# Patient Record
Sex: Male | Born: 1950
Health system: Southern US, Community
[De-identification: ages and names within clinical notes are randomized; demographics above are authoritative.]

## PROBLEM LIST (undated history)

## (undated) DIAGNOSIS — I2510 Atherosclerotic heart disease of native coronary artery without angina pectoris: Secondary | ICD-10-CM

## (undated) DIAGNOSIS — I639 Cerebral infarction, unspecified: Secondary | ICD-10-CM

---

## 2017-05-04 ENCOUNTER — Other Ambulatory Visit (HOSPITAL_COMMUNITY): Payer: Self-pay | Admitting: Family Medicine

## 2017-05-04 DIAGNOSIS — R1319 Other dysphagia: Secondary | ICD-10-CM

## 2017-05-05 ENCOUNTER — Other Ambulatory Visit: Payer: Self-pay

## 2017-05-05 ENCOUNTER — Inpatient Hospital Stay (HOSPITAL_COMMUNITY): Payer: Medicare Other

## 2017-05-05 ENCOUNTER — Encounter (HOSPITAL_COMMUNITY): Payer: Self-pay | Admitting: *Deleted

## 2017-05-05 ENCOUNTER — Emergency Department (HOSPITAL_COMMUNITY): Payer: Medicare Other

## 2017-05-05 ENCOUNTER — Inpatient Hospital Stay (HOSPITAL_COMMUNITY)
Admission: EM | Admit: 2017-05-05 | Discharge: 2017-05-23 | DRG: 871 | Disposition: A | Discharge status: Skilled Nursing Facility | Payer: Medicare Other | Attending: Family Medicine | Admitting: Family Medicine

## 2017-05-05 DIAGNOSIS — N3000 Acute cystitis without hematuria: Secondary | ICD-10-CM | POA: Diagnosis present

## 2017-05-05 DIAGNOSIS — E1165 Type 2 diabetes mellitus with hyperglycemia: Secondary | ICD-10-CM | POA: Diagnosis present

## 2017-05-05 DIAGNOSIS — B370 Candidal stomatitis: Secondary | ICD-10-CM | POA: Diagnosis present

## 2017-05-05 DIAGNOSIS — N183 Chronic kidney disease, stage 3 (moderate): Secondary | ICD-10-CM | POA: Diagnosis not present

## 2017-05-05 DIAGNOSIS — G9341 Metabolic encephalopathy: Secondary | ICD-10-CM | POA: Diagnosis present

## 2017-05-05 DIAGNOSIS — E118 Type 2 diabetes mellitus with unspecified complications: Secondary | ICD-10-CM | POA: Diagnosis not present

## 2017-05-05 DIAGNOSIS — J9601 Acute respiratory failure with hypoxia: Secondary | ICD-10-CM | POA: Diagnosis present

## 2017-05-05 DIAGNOSIS — R6521 Severe sepsis with septic shock: Secondary | ICD-10-CM | POA: Diagnosis present

## 2017-05-05 DIAGNOSIS — G931 Anoxic brain damage, not elsewhere classified: Secondary | ICD-10-CM | POA: Diagnosis present

## 2017-05-05 DIAGNOSIS — G8250 Quadriplegia, unspecified: Secondary | ICD-10-CM | POA: Diagnosis present

## 2017-05-05 DIAGNOSIS — C787 Secondary malignant neoplasm of liver and intrahepatic bile duct: Secondary | ICD-10-CM | POA: Diagnosis present

## 2017-05-05 DIAGNOSIS — Z9911 Dependence on respirator [ventilator] status: Secondary | ICD-10-CM

## 2017-05-05 DIAGNOSIS — N179 Acute kidney failure, unspecified: Secondary | ICD-10-CM | POA: Diagnosis present

## 2017-05-05 DIAGNOSIS — D638 Anemia in other chronic diseases classified elsewhere: Secondary | ICD-10-CM | POA: Diagnosis present

## 2017-05-05 DIAGNOSIS — Z66 Do not resuscitate: Secondary | ICD-10-CM | POA: Diagnosis not present

## 2017-05-05 DIAGNOSIS — A419 Sepsis, unspecified organism: Secondary | ICD-10-CM | POA: Diagnosis present

## 2017-05-05 DIAGNOSIS — G9340 Encephalopathy, unspecified: Secondary | ICD-10-CM | POA: Diagnosis not present

## 2017-05-05 DIAGNOSIS — Z9289 Personal history of other medical treatment: Secondary | ICD-10-CM

## 2017-05-05 DIAGNOSIS — E43 Unspecified severe protein-calorie malnutrition: Secondary | ICD-10-CM | POA: Diagnosis present

## 2017-05-05 DIAGNOSIS — Z79899 Other long term (current) drug therapy: Secondary | ICD-10-CM

## 2017-05-05 DIAGNOSIS — I639 Cerebral infarction, unspecified: Secondary | ICD-10-CM | POA: Diagnosis present

## 2017-05-05 DIAGNOSIS — I69354 Hemiplegia and hemiparesis following cerebral infarction affecting left non-dominant side: Secondary | ICD-10-CM | POA: Diagnosis not present

## 2017-05-05 DIAGNOSIS — L89152 Pressure ulcer of sacral region, stage 2: Secondary | ICD-10-CM | POA: Diagnosis present

## 2017-05-05 DIAGNOSIS — E872 Acidosis: Secondary | ICD-10-CM | POA: Diagnosis present

## 2017-05-05 DIAGNOSIS — J9602 Acute respiratory failure with hypercapnia: Secondary | ICD-10-CM | POA: Diagnosis not present

## 2017-05-05 DIAGNOSIS — E876 Hypokalemia: Secondary | ICD-10-CM | POA: Diagnosis not present

## 2017-05-05 DIAGNOSIS — R471 Dysarthria and anarthria: Secondary | ICD-10-CM | POA: Diagnosis present

## 2017-05-05 DIAGNOSIS — R1310 Dysphagia, unspecified: Secondary | ICD-10-CM | POA: Diagnosis not present

## 2017-05-05 DIAGNOSIS — D696 Thrombocytopenia, unspecified: Secondary | ICD-10-CM | POA: Diagnosis present

## 2017-05-05 DIAGNOSIS — C7971 Secondary malignant neoplasm of right adrenal gland: Secondary | ICD-10-CM | POA: Diagnosis not present

## 2017-05-05 DIAGNOSIS — J986 Disorders of diaphragm: Secondary | ICD-10-CM | POA: Diagnosis not present

## 2017-05-05 DIAGNOSIS — C7972 Secondary malignant neoplasm of left adrenal gland: Secondary | ICD-10-CM | POA: Diagnosis not present

## 2017-05-05 DIAGNOSIS — C7970 Secondary malignant neoplasm of unspecified adrenal gland: Secondary | ICD-10-CM | POA: Diagnosis present

## 2017-05-05 DIAGNOSIS — I2510 Atherosclerotic heart disease of native coronary artery without angina pectoris: Secondary | ICD-10-CM | POA: Diagnosis present

## 2017-05-05 DIAGNOSIS — E871 Hypo-osmolality and hyponatremia: Secondary | ICD-10-CM | POA: Diagnosis present

## 2017-05-05 DIAGNOSIS — E870 Hyperosmolality and hypernatremia: Secondary | ICD-10-CM | POA: Diagnosis present

## 2017-05-05 DIAGNOSIS — Z515 Encounter for palliative care: Secondary | ICD-10-CM | POA: Diagnosis not present

## 2017-05-05 DIAGNOSIS — J690 Pneumonitis due to inhalation of food and vomit: Secondary | ICD-10-CM | POA: Diagnosis present

## 2017-05-05 DIAGNOSIS — R918 Other nonspecific abnormal finding of lung field: Secondary | ICD-10-CM | POA: Diagnosis present

## 2017-05-05 DIAGNOSIS — C801 Malignant (primary) neoplasm, unspecified: Secondary | ICD-10-CM | POA: Diagnosis not present

## 2017-05-05 DIAGNOSIS — Z681 Body mass index (BMI) 19 or less, adult: Secondary | ICD-10-CM | POA: Diagnosis not present

## 2017-05-05 DIAGNOSIS — Z7189 Other specified counseling: Secondary | ICD-10-CM

## 2017-05-05 DIAGNOSIS — Z794 Long term (current) use of insulin: Secondary | ICD-10-CM

## 2017-05-05 DIAGNOSIS — R571 Hypovolemic shock: Secondary | ICD-10-CM | POA: Diagnosis present

## 2017-05-05 DIAGNOSIS — K769 Liver disease, unspecified: Secondary | ICD-10-CM | POA: Diagnosis not present

## 2017-05-05 DIAGNOSIS — I69320 Aphasia following cerebral infarction: Secondary | ICD-10-CM

## 2017-05-05 DIAGNOSIS — Z7982 Long term (current) use of aspirin: Secondary | ICD-10-CM

## 2017-05-05 DIAGNOSIS — I700 Atherosclerosis of aorta: Secondary | ICD-10-CM | POA: Diagnosis present

## 2017-05-05 DIAGNOSIS — T80212A Local infection due to central venous catheter, initial encounter: Secondary | ICD-10-CM

## 2017-05-05 DIAGNOSIS — Z4659 Encounter for fitting and adjustment of other gastrointestinal appliance and device: Secondary | ICD-10-CM

## 2017-05-05 DIAGNOSIS — Z7401 Bed confinement status: Secondary | ICD-10-CM

## 2017-05-05 DIAGNOSIS — I10 Essential (primary) hypertension: Secondary | ICD-10-CM | POA: Diagnosis present

## 2017-05-05 DIAGNOSIS — T80212D Local infection due to central venous catheter, subsequent encounter: Secondary | ICD-10-CM | POA: Diagnosis not present

## 2017-05-05 DIAGNOSIS — I5030 Unspecified diastolic (congestive) heart failure: Secondary | ICD-10-CM | POA: Diagnosis not present

## 2017-05-05 DIAGNOSIS — J189 Pneumonia, unspecified organism: Secondary | ICD-10-CM

## 2017-05-05 DIAGNOSIS — R400 Somnolence: Secondary | ICD-10-CM | POA: Diagnosis not present

## 2017-05-05 DIAGNOSIS — J9600 Acute respiratory failure, unspecified whether with hypoxia or hypercapnia: Secondary | ICD-10-CM

## 2017-05-05 DIAGNOSIS — E785 Hyperlipidemia, unspecified: Secondary | ICD-10-CM | POA: Diagnosis present

## 2017-05-05 DIAGNOSIS — I63512 Cerebral infarction due to unspecified occlusion or stenosis of left middle cerebral artery: Secondary | ICD-10-CM | POA: Diagnosis not present

## 2017-05-05 DIAGNOSIS — R410 Disorientation, unspecified: Secondary | ICD-10-CM | POA: Diagnosis not present

## 2017-05-05 DIAGNOSIS — I69391 Dysphagia following cerebral infarction: Secondary | ICD-10-CM

## 2017-05-05 DIAGNOSIS — J9690 Respiratory failure, unspecified, unspecified whether with hypoxia or hypercapnia: Secondary | ICD-10-CM

## 2017-05-05 DIAGNOSIS — L8990 Pressure ulcer of unspecified site, unspecified stage: Secondary | ICD-10-CM | POA: Diagnosis present

## 2017-05-05 HISTORY — DX: Cerebral infarction, unspecified: I63.9

## 2017-05-05 HISTORY — DX: Atherosclerotic heart disease of native coronary artery without angina pectoris: I25.10

## 2017-05-05 LAB — COMPREHENSIVE METABOLIC PANEL
ALBUMIN: 2.6 g/dL — AB (ref 3.5–5.0)
ALT: 39 U/L (ref 17–63)
AST: 59 U/L — AB (ref 15–41)
Alkaline Phosphatase: 433 U/L — ABNORMAL HIGH (ref 38–126)
Anion gap: 20 — ABNORMAL HIGH (ref 5–15)
BILIRUBIN TOTAL: 0.8 mg/dL (ref 0.3–1.2)
BUN: 91 mg/dL — AB (ref 6–20)
CHLORIDE: 124 mmol/L — AB (ref 101–111)
CO2: 19 mmol/L — ABNORMAL LOW (ref 22–32)
Calcium: 9 mg/dL (ref 8.9–10.3)
Creatinine, Ser: 3.48 mg/dL — ABNORMAL HIGH (ref 0.61–1.24)
GFR calc Af Amer: 19 mL/min — ABNORMAL LOW (ref >60)
GFR calc non Af Amer: 17 mL/min — ABNORMAL LOW (ref >60)
Glucose, Bld: 246 mg/dL — ABNORMAL HIGH (ref 65–99)
POTASSIUM: 4.7 mmol/L (ref 3.5–5.1)
Sodium: 163 mmol/L (ref 135–145)
TOTAL PROTEIN: 7 g/dL (ref 6.5–8.1)

## 2017-05-05 LAB — CBC WITH DIFFERENTIAL/PLATELET
BASOS ABS: 0 10*3/uL (ref 0.0–0.1)
Basophils Relative: 0 %
Eosinophils Absolute: 0 10*3/uL (ref 0.0–0.7)
Eosinophils Relative: 0 %
HEMATOCRIT: 48.8 % (ref 39.0–52.0)
HEMOGLOBIN: 13.8 g/dL (ref 13.0–17.0)
LYMPHS ABS: 2.3 10*3/uL (ref 0.7–4.0)
LYMPHS PCT: 11 %
MCH: 28.5 pg (ref 26.0–34.0)
MCHC: 28.3 g/dL — ABNORMAL LOW (ref 30.0–36.0)
MCV: 100.8 fL — ABNORMAL HIGH (ref 78.0–100.0)
MONOS PCT: 5 %
Monocytes Absolute: 1.1 10*3/uL — ABNORMAL HIGH (ref 0.1–1.0)
NEUTROS PCT: 84 %
Neutro Abs: 17.6 10*3/uL — ABNORMAL HIGH (ref 1.7–7.7)
Platelets: 249 10*3/uL (ref 150–400)
RBC: 4.84 MIL/uL (ref 4.22–5.81)
RDW: 16.7 % — AB (ref 11.5–15.5)
WBC: 21 10*3/uL — AB (ref 4.0–10.5)

## 2017-05-05 LAB — I-STAT ARTERIAL BLOOD GAS, ED
Acid-base deficit: 10 mmol/L — ABNORMAL HIGH (ref 0.0–2.0)
Bicarbonate: 19.8 mmol/L — ABNORMAL LOW (ref 20.0–28.0)
O2 SAT: 64 %
TCO2: 22 mmol/L (ref 22–32)
pCO2 arterial: 69.6 mmHg (ref 32.0–48.0)
pH, Arterial: 7.07 — CL (ref 7.350–7.450)
pO2, Arterial: 51 mmHg — ABNORMAL LOW (ref 83.0–108.0)

## 2017-05-05 LAB — URINALYSIS, ROUTINE W REFLEX MICROSCOPIC
Bilirubin Urine: NEGATIVE
Glucose, UA: NEGATIVE mg/dL
KETONES UR: 5 mg/dL — AB
Leukocytes, UA: NEGATIVE
Nitrite: NEGATIVE
PROTEIN: 100 mg/dL — AB
SQUAMOUS EPITHELIAL / LPF: NONE SEEN
Specific Gravity, Urine: 1.021 (ref 1.005–1.030)
pH: 7 (ref 5.0–8.0)

## 2017-05-05 LAB — I-STAT CG4 LACTIC ACID, ED: LACTIC ACID, VENOUS: 9.93 mmol/L — AB (ref 0.5–1.9)

## 2017-05-05 LAB — I-STAT TROPONIN, ED: TROPONIN I, POC: 0.01 ng/mL (ref 0.00–0.08)

## 2017-05-05 MED ORDER — MIDAZOLAM HCL 2 MG/2ML IJ SOLN
INTRAMUSCULAR | Status: AC
  Filled 2017-05-05: qty 2

## 2017-05-05 MED ORDER — PIPERACILLIN-TAZOBACTAM 3.375 G IVPB
3.3750 g | Freq: Three times a day (TID) | INTRAVENOUS | Status: AC
  Administered 2017-05-06 – 2017-05-11 (×18): 3.375 g via INTRAVENOUS
  Filled 2017-05-05 (×19): qty 50

## 2017-05-05 MED ORDER — FENTANYL CITRATE (PF) 100 MCG/2ML IJ SOLN
50.0000 ug | INTRAMUSCULAR | Status: DC | PRN
  Administered 2017-05-07: 50 ug via INTRAVENOUS
  Filled 2017-05-05: qty 2

## 2017-05-05 MED ORDER — NOREPINEPHRINE BITARTRATE 1 MG/ML IV SOLN
0.0000 ug/min | Freq: Once | INTRAVENOUS | Status: AC
  Administered 2017-05-05: 10 ug/min via INTRAVENOUS

## 2017-05-05 MED ORDER — FENTANYL 2500MCG IN NS 250ML (10MCG/ML) PREMIX INFUSION: 0.0000 ug/h | INTRAVENOUS | Status: DC

## 2017-05-05 MED ORDER — FENTANYL CITRATE (PF) 100 MCG/2ML IJ SOLN: 50.0000 ug | INTRAMUSCULAR | Status: DC | PRN

## 2017-05-05 MED ORDER — ACETAMINOPHEN 650 MG RE SUPP
650.0000 mg | Freq: Once | RECTAL | Status: AC
  Administered 2017-05-05: 650 mg via RECTAL
  Filled 2017-05-05: qty 1

## 2017-05-05 MED ORDER — INSULIN ASPART 100 UNIT/ML ~~LOC~~ SOLN: 2.0000 [IU] | SUBCUTANEOUS | Status: DC

## 2017-05-05 MED ORDER — HEPARIN SODIUM (PORCINE) 5000 UNIT/ML IJ SOLN
5000.0000 [IU] | Freq: Three times a day (TID) | INTRAMUSCULAR | Status: DC
  Administered 2017-05-06 – 2017-05-08 (×8): 5000 [IU] via SUBCUTANEOUS
  Filled 2017-05-05 (×11): qty 1

## 2017-05-05 MED ORDER — DEXTROSE 5 % IV SOLN
0.0000 ug/min | INTRAVENOUS | Status: DC
  Administered 2017-05-05: 15 ug/min via INTRAVENOUS
  Administered 2017-05-06: 5 ug/min via INTRAVENOUS
  Filled 2017-05-05 (×3): qty 4

## 2017-05-05 MED ORDER — PIPERACILLIN-TAZOBACTAM 3.375 G IVPB 30 MIN
3.3750 g | Freq: Once | INTRAVENOUS | Status: AC
  Administered 2017-05-05: 3.375 g via INTRAVENOUS
  Filled 2017-05-05: qty 50

## 2017-05-05 MED ORDER — MIDAZOLAM HCL 2 MG/2ML IJ SOLN: 2.0000 mg | Freq: Once | INTRAMUSCULAR | Status: DC

## 2017-05-05 MED ORDER — SODIUM CHLORIDE 0.9 % IV BOLUS (SEPSIS): 1000.0000 mL | Freq: Once | INTRAVENOUS | Status: DC

## 2017-05-05 MED ORDER — SODIUM CHLORIDE 0.9 % IV BOLUS (SEPSIS)
500.0000 mL | Freq: Once | INTRAVENOUS | Status: AC
  Administered 2017-05-05: 500 mL via INTRAVENOUS

## 2017-05-05 MED ORDER — SODIUM CHLORIDE 0.9 % IV BOLUS (SEPSIS)
1000.0000 mL | Freq: Once | INTRAVENOUS | Status: AC
  Administered 2017-05-05: 1000 mL via INTRAVENOUS

## 2017-05-05 MED ORDER — VANCOMYCIN HCL IN DEXTROSE 1-5 GM/200ML-% IV SOLN
1000.0000 mg | Freq: Once | INTRAVENOUS | Status: AC
  Administered 2017-05-05: 1000 mg via INTRAVENOUS
  Filled 2017-05-05: qty 200

## 2017-05-05 MED ORDER — FREE WATER
200.0000 mL | Freq: Four times a day (QID) | Status: DC
  Administered 2017-05-06 – 2017-05-07 (×6): 200 mL

## 2017-05-05 MED ORDER — PROPOFOL 1000 MG/100ML IV EMUL: 5.0000 ug/kg/min | Freq: Once | INTRAVENOUS | Status: DC

## 2017-05-05 MED ORDER — VANCOMYCIN HCL IN DEXTROSE 1-5 GM/200ML-% IV SOLN: 1000.0000 mg | INTRAVENOUS | Status: DC

## 2017-05-05 MED ORDER — SODIUM CHLORIDE 0.9 % IV SOLN: 250.0000 mL | INTRAVENOUS | Status: DC | PRN

## 2017-05-05 MED ORDER — PANTOPRAZOLE SODIUM 40 MG IV SOLR
40.0000 mg | Freq: Every day | INTRAVENOUS | Status: DC
  Administered 2017-05-06 – 2017-05-13 (×9): 40 mg via INTRAVENOUS
  Filled 2017-05-05 (×12): qty 40

## 2017-05-05 NOTE — ED Provider Notes (Signed)
I saw and evaluated the patient, reviewed the resident's note and I agree with the findings and plan.   EKG Interpretation  Date/Time:  Saturday May 05 2017 21:11:40 EST Ventricular Rate:  137 PR Interval:    QRS Duration: 91 QT Interval:  276 QTC Calculation: 417 R Axis:   49 Text Interpretation:  Sinus tachycardia with irregular rate Inferoposterior infarct, recent Lateral leads are also involved no prior EKG  Confirmed by Brantley Stage [phone]) on 05/05/2017 9:44:01 PM      CRITICAL CARE Performed by: Forde Dandy   Total critical care time: 35 minutes  Critical care time was exclusive of separately billable procedures and treating other patients.  Critical care was necessary to treat or prevent imminent or life-threatening deterioration.  Critical care was time spent personally by me on the following activities: development of treatment plan with patient and/or surrogate as well as nursing, discussions with consultants, evaluation of patient's response to treatment, examination of patient, obtaining history from patient or surrogate, ordering and performing treatments and interventions, ordering and review of laboratory studies, ordering and review of radiographic studies, pulse oximetry and re-evaluation of patient's condition.   66 year old male with previous history of CVA who presents with respiratory failure from skilled nursing facility.  Per facility staff, staff members were feeding him when he started to choke and have shortness of breath.  Became unconscious.  EMS was called.  He was intubated by EMS, and brought to ED for evaluation. Per facility, he is full code.   In ED, patient is febrile, tachycardic, and hypotensive.  Had persistent hypotension despite ongoing IV fluids, and started on peripheral Levophed.  Code sepsis is activated, and he is receiving 2.5 L of IV fluids as well as vancomycin and Zosyn empirically.  Elevated lactate of 9.93 and leukocytosis of 21. CXR  w/o obvious infiltrate. ETT advanced after CXR. UA pending. Plan for admission to ICU for respiratory failure due to choking event and septic shock.   Forde Dandy, MD 05/05/17 [phone]

## 2017-05-05 NOTE — Progress Notes (Signed)
Pharmacy Antibiotic Note  William Nolan is a 66 y.o. male admitted on 05/05/2017 from NH with SOB/VDRF/PNA/sepsis.  Pharmacy has been consulted for .Vancomycin and Zosyn  Dosing.  Vancomycin 1 g IV given in ED at  2145  Plan: Vancomycin 1 g IV q48h Zosyn 3.375 g IV q8h  F/U renal function  Height: 6' (182.9 cm) Weight: 150 lb (68 kg) IBW/kg (Calculated) : 77.6  Temp (24hrs), Avg:100.4 F (38 C), Min:99.9 F (37.7 C), Max:101.1 F (38.4 C)  Recent Labs  Lab 05/05/17 2138 05/05/17 2144  WBC  --  21.0*  CREATININE  --  3.48*  LATICACIDVEN 9.93*  --     Estimated Creatinine Clearance: 19.5 mL/min (A) (by C-G formula based on SCr of 3.48 mg/dL (H)).    No Known Allergies  Caryl Pina 05/05/2017 11:38 PM
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
## 2017-05-05 NOTE — ED Notes (Signed)
1000ccnss bolus

## 2017-05-05 NOTE — ED Notes (Signed)
pvcs increasing still no response to any hting

## 2017-05-05 NOTE — ED Notes (Signed)
Rep;ort called to robin rn on 14m

## 2017-05-05 NOTE — ED Notes (Signed)
The pt has had no movement of any type
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
## 2017-05-05 NOTE — ED Notes (Signed)
2nd bolus nss 1000

## 2017-05-06 ENCOUNTER — Inpatient Hospital Stay (HOSPITAL_COMMUNITY): Payer: Medicare Other

## 2017-05-06 DIAGNOSIS — Z9911 Dependence on respirator [ventilator] status: Secondary | ICD-10-CM

## 2017-05-06 DIAGNOSIS — N3000 Acute cystitis without hematuria: Secondary | ICD-10-CM | POA: Diagnosis present

## 2017-05-06 DIAGNOSIS — I63512 Cerebral infarction due to unspecified occlusion or stenosis of left middle cerebral artery: Secondary | ICD-10-CM

## 2017-05-06 DIAGNOSIS — A419 Sepsis, unspecified organism: Secondary | ICD-10-CM | POA: Diagnosis present

## 2017-05-06 DIAGNOSIS — J690 Pneumonitis due to inhalation of food and vomit: Secondary | ICD-10-CM | POA: Diagnosis present

## 2017-05-06 DIAGNOSIS — E870 Hyperosmolality and hypernatremia: Secondary | ICD-10-CM | POA: Diagnosis present

## 2017-05-06 DIAGNOSIS — R6521 Severe sepsis with septic shock: Secondary | ICD-10-CM

## 2017-05-06 DIAGNOSIS — N179 Acute kidney failure, unspecified: Secondary | ICD-10-CM | POA: Diagnosis present

## 2017-05-06 LAB — BASIC METABOLIC PANEL
ANION GAP: 13 (ref 5–15)
Anion gap: 10 (ref 5–15)
Anion gap: 9 (ref 5–15)
Anion gap: 11 (ref 5–15)
BUN: 79 mg/dL — ABNORMAL HIGH (ref 6–20)
BUN: 76 mg/dL — AB (ref 6–20)
BUN: 76 mg/dL — AB (ref 6–20)
BUN: 80 mg/dL — AB (ref 6–20)
CALCIUM: 7.4 mg/dL — AB (ref 8.9–10.3)
CHLORIDE: 126 mmol/L — AB (ref 101–111)
CHLORIDE: 125 mmol/L — AB (ref 101–111)
CHLORIDE: 126 mmol/L — AB (ref 101–111)
CHLORIDE: 122 mmol/L — AB (ref 101–111)
CO2: 18 mmol/L — AB (ref 22–32)
CO2: 21 mmol/L — AB (ref 22–32)
CO2: 22 mmol/L (ref 22–32)
CO2: 22 mmol/L (ref 22–32)
CREATININE: 2.44 mg/dL — AB (ref 0.61–1.24)
CREATININE: 2.41 mg/dL — AB (ref 0.61–1.24)
CREATININE: 2.41 mg/dL — AB (ref 0.61–1.24)
Calcium: 7.3 mg/dL — ABNORMAL LOW (ref 8.9–10.3)
Calcium: 7.5 mg/dL — ABNORMAL LOW (ref 8.9–10.3)
Calcium: 7.5 mg/dL — ABNORMAL LOW (ref 8.9–10.3)
Creatinine, Ser: 2.64 mg/dL — ABNORMAL HIGH (ref 0.61–1.24)
GFR calc Af Amer: 27 mL/min — ABNORMAL LOW (ref >60)
GFR calc Af Amer: 30 mL/min — ABNORMAL LOW (ref >60)
GFR calc Af Amer: 30 mL/min — ABNORMAL LOW (ref >60)
GFR calc non Af Amer: 23 mL/min — ABNORMAL LOW (ref >60)
GFR calc non Af Amer: 26 mL/min — ABNORMAL LOW (ref >60)
GFR calc non Af Amer: 26 mL/min — ABNORMAL LOW (ref >60)
GFR calc non Af Amer: 26 mL/min — ABNORMAL LOW (ref >60)
GFR, EST AFRICAN AMERICAN: 30 mL/min — AB (ref >60)
Glucose, Bld: 252 mg/dL — ABNORMAL HIGH (ref 65–99)
Glucose, Bld: 225 mg/dL — ABNORMAL HIGH (ref 65–99)
Glucose, Bld: 130 mg/dL — ABNORMAL HIGH (ref 65–99)
Glucose, Bld: 292 mg/dL — ABNORMAL HIGH (ref 65–99)
Potassium: 3.9 mmol/L (ref 3.5–5.1)
Potassium: 3.8 mmol/L (ref 3.5–5.1)
Potassium: 3.7 mmol/L (ref 3.5–5.1)
Potassium: 3.4 mmol/L — ABNORMAL LOW (ref 3.5–5.1)
SODIUM: 157 mmol/L — AB (ref 135–145)
SODIUM: 157 mmol/L — AB (ref 135–145)
SODIUM: 155 mmol/L — AB (ref 135–145)
Sodium: 156 mmol/L — ABNORMAL HIGH (ref 135–145)

## 2017-05-06 LAB — LACTIC ACID, PLASMA
LACTIC ACID, VENOUS: 3.7 mmol/L — AB (ref 0.5–1.9)
LACTIC ACID, VENOUS: 5.8 mmol/L — AB (ref 0.5–1.9)
LACTIC ACID, VENOUS: 8.1 mmol/L — AB (ref 0.5–1.9)

## 2017-05-06 LAB — GLUCOSE, CAPILLARY
GLUCOSE-CAPILLARY: 104 mg/dL — AB (ref 65–99)
GLUCOSE-CAPILLARY: 165 mg/dL — AB (ref 65–99)
GLUCOSE-CAPILLARY: 185 mg/dL — AB (ref 65–99)
GLUCOSE-CAPILLARY: 253 mg/dL — AB (ref 65–99)
GLUCOSE-CAPILLARY: 255 mg/dL — AB (ref 65–99)
Glucose-Capillary: 224 mg/dL — ABNORMAL HIGH (ref 65–99)
Glucose-Capillary: 233 mg/dL — ABNORMAL HIGH (ref 65–99)

## 2017-05-06 LAB — BLOOD CULTURE ID PANEL (REFLEXED)
Acinetobacter baumannii: NOT DETECTED
CANDIDA GLABRATA: NOT DETECTED
CANDIDA TROPICALIS: NOT DETECTED
Candida albicans: NOT DETECTED
Candida krusei: NOT DETECTED
Candida parapsilosis: NOT DETECTED
ENTEROBACTER CLOACAE COMPLEX: NOT DETECTED
Enterobacteriaceae species: NOT DETECTED
Enterococcus species: NOT DETECTED
Escherichia coli: NOT DETECTED
HAEMOPHILUS INFLUENZAE: NOT DETECTED
KLEBSIELLA PNEUMONIAE: NOT DETECTED
Klebsiella oxytoca: NOT DETECTED
Listeria monocytogenes: NOT DETECTED
Methicillin resistance: DETECTED — AB
NEISSERIA MENINGITIDIS: NOT DETECTED
PROTEUS SPECIES: NOT DETECTED
Pseudomonas aeruginosa: NOT DETECTED
STAPHYLOCOCCUS SPECIES: DETECTED — AB
STREPTOCOCCUS PYOGENES: NOT DETECTED
STREPTOCOCCUS SPECIES: NOT DETECTED
Serratia marcescens: NOT DETECTED
Staphylococcus aureus (BCID): NOT DETECTED
Streptococcus agalactiae: NOT DETECTED
Streptococcus pneumoniae: NOT DETECTED

## 2017-05-06 LAB — BLOOD GAS, ARTERIAL
Acid-base deficit: 6.6 mmol/L — ABNORMAL HIGH (ref 0.0–2.0)
Bicarbonate: 18.5 mmol/L — ABNORMAL LOW (ref 20.0–28.0)
DRAWN BY: 36277
FIO2: 0.6
MECHVT: 560 mL
O2 Saturation: 85 %
PH ART: 7.323 — AB (ref 7.350–7.450)
PIP: 8 cmH2O
PO2 ART: 50.6 mmHg — AB (ref 83.0–108.0)
Patient temperature: 96.9
RATE: 26 resp/min
pCO2 arterial: 36.1 mmHg (ref 32.0–48.0)

## 2017-05-06 LAB — CBC
HCT: 38 % — ABNORMAL LOW (ref 39.0–52.0)
HEMOGLOBIN: 10.9 g/dL — AB (ref 13.0–17.0)
MCH: 27.9 pg (ref 26.0–34.0)
MCHC: 28.7 g/dL — ABNORMAL LOW (ref 30.0–36.0)
MCV: 97.4 fL (ref 78.0–100.0)
Platelets: 165 10*3/uL (ref 150–400)
RBC: 3.9 MIL/uL — AB (ref 4.22–5.81)
RDW: 16.4 % — ABNORMAL HIGH (ref 11.5–15.5)
WBC: 19.1 10*3/uL — ABNORMAL HIGH (ref 4.0–10.5)

## 2017-05-06 LAB — PROCALCITONIN
PROCALCITONIN: 3.63 ng/mL
Procalcitonin: 4.31 ng/mL

## 2017-05-06 LAB — TROPONIN I
TROPONIN I: 0.04 ng/mL — AB (ref <0.03)
TROPONIN I: 0.06 ng/mL — AB (ref <0.03)
TROPONIN I: 0.06 ng/mL — AB (ref <0.03)

## 2017-05-06 LAB — MAGNESIUM
MAGNESIUM: 2 mg/dL (ref 1.7–2.4)
Magnesium: 2.2 mg/dL (ref 1.7–2.4)
Magnesium: 2 mg/dL (ref 1.7–2.4)

## 2017-05-06 LAB — AMMONIA
AMMONIA: 63 umol/L — AB (ref 9–35)
AMMONIA: 37 umol/L — AB (ref 9–35)

## 2017-05-06 LAB — PHOSPHORUS
PHOSPHORUS: 4.8 mg/dL — AB (ref 2.5–4.6)
PHOSPHORUS: 2.9 mg/dL (ref 2.5–4.6)
Phosphorus: 3.3 mg/dL (ref 2.5–4.6)

## 2017-05-06 LAB — MRSA PCR SCREENING: MRSA by PCR: NEGATIVE

## 2017-05-06 MED ORDER — INSULIN ASPART 100 UNIT/ML ~~LOC~~ SOLN
3.0000 [IU] | SUBCUTANEOUS | Status: DC
  Administered 2017-05-06 – 2017-05-10 (×17): 3 [IU] via SUBCUTANEOUS

## 2017-05-06 MED ORDER — LACTATED RINGERS IV BOLUS (SEPSIS)
1000.0000 mL | Freq: Once | INTRAVENOUS | Status: AC
  Administered 2017-05-06: 1000 mL via INTRAVENOUS

## 2017-05-06 MED ORDER — INSULIN ASPART 100 UNIT/ML ~~LOC~~ SOLN
0.0000 [IU] | SUBCUTANEOUS | Status: DC
  Administered 2017-05-06: 5 [IU] via SUBCUTANEOUS
  Administered 2017-05-06: 2 [IU] via SUBCUTANEOUS
  Administered 2017-05-06: 5 [IU] via SUBCUTANEOUS
  Administered 2017-05-06: 3 [IU] via SUBCUTANEOUS
  Administered 2017-05-07: 1 [IU] via SUBCUTANEOUS
  Administered 2017-05-07: 3 [IU] via SUBCUTANEOUS
  Administered 2017-05-07 (×3): 2 [IU] via SUBCUTANEOUS
  Administered 2017-05-08: 3 [IU] via SUBCUTANEOUS
  Administered 2017-05-08 (×4): 1 [IU] via SUBCUTANEOUS
  Administered 2017-05-08: 5 [IU] via SUBCUTANEOUS
  Administered 2017-05-09: 2 [IU] via SUBCUTANEOUS
  Administered 2017-05-09 (×2): 3 [IU] via SUBCUTANEOUS
  Administered 2017-05-09: 5 [IU] via SUBCUTANEOUS
  Administered 2017-05-09: 3 [IU] via SUBCUTANEOUS
  Administered 2017-05-09: 2 [IU] via SUBCUTANEOUS
  Administered 2017-05-10: 5 [IU] via SUBCUTANEOUS
  Administered 2017-05-10 (×2): 7 [IU] via SUBCUTANEOUS
  Administered 2017-05-10: 3 [IU] via SUBCUTANEOUS

## 2017-05-06 MED ORDER — VITAL HIGH PROTEIN PO LIQD
1000.0000 mL | ORAL | Status: DC
  Administered 2017-05-06: 1000 mL

## 2017-05-06 MED ORDER — SODIUM CHLORIDE 0.9 % IV BOLUS (SEPSIS)
500.0000 mL | Freq: Once | INTRAVENOUS | Status: AC
  Administered 2017-05-06: 500 mL via INTRAVENOUS

## 2017-05-06 MED ORDER — PRO-STAT SUGAR FREE PO LIQD
30.0000 mL | Freq: Two times a day (BID) | ORAL | Status: DC
  Administered 2017-05-06 (×2): 30 mL
  Filled 2017-05-06 (×4): qty 30

## 2017-05-06 MED ORDER — LACTATED RINGERS IV SOLN
INTRAVENOUS | Status: DC
  Administered 2017-05-06 – 2017-05-07 (×3): via INTRAVENOUS

## 2017-05-06 MED ORDER — ACETAMINOPHEN 160 MG/5ML PO SOLN
650.0000 mg | Freq: Four times a day (QID) | ORAL | Status: DC | PRN
  Administered 2017-05-06 – 2017-05-08 (×3): 650 mg
  Filled 2017-05-06 (×3): qty 20.3

## 2017-05-06 MED ORDER — ASPIRIN 325 MG PO TABS
325.0000 mg | ORAL_TABLET | Freq: Every day | ORAL | Status: DC
  Administered 2017-05-06: 325 mg
  Filled 2017-05-06 (×2): qty 1

## 2017-05-06 NOTE — Progress Notes (Signed)
Pt transferred from the ER to [id number] w/o complications. Uneventful trip. Pt is stable at this time. RN's at bedside

## 2017-05-06 NOTE — Progress Notes (Signed)
Received pt from ED, slight withdrawal to painful stimuli, levophed @ 15 mcg LPIV, oral ETT in place.

## 2017-05-06 NOTE — Progress Notes (Signed)
Called [facility] and spoke to nurse regarding pt cvp 3 and sudden short duration of  HR 140 duration.

## 2017-05-06 NOTE — Progress Notes (Signed)
Tropin 0.04 reported to Hayden Pedro, NP at bedside

## 2017-05-06 NOTE — Progress Notes (Signed)
PHARMACY - PHYSICIAN COMMUNICATION CRITICAL VALUE ALERT - BLOOD CULTURE IDENTIFICATION (BCID)  William Nolan is an 66 y.o. male who presented to [facility] on 05/05/2017 with a chief complaint of choking event and unresponsive  Assessment:  Admitted following aspiration event. Also with probable UTI.   Name of physician (or Provider) Contacted: Dr. Oletta Darter  Current antibiotics: Vancomycin + Zosyn  Changes to prescribed antibiotics recommended:  No changes recommended.  Likely contaminant as only in 1 set of BCx.  Continue to follow clinical progression    Results for orders placed or performed during the hospital encounter of 05/05/17  Blood Culture ID Panel (Reflexed) (Collected: 05/05/2017  9:44 PM)  Result Value Ref Range   Enterococcus species NOT DETECTED NOT DETECTED   Listeria monocytogenes NOT DETECTED NOT DETECTED   Staphylococcus species DETECTED (A) NOT DETECTED   Staphylococcus aureus NOT DETECTED NOT DETECTED   Methicillin resistance DETECTED (A) NOT DETECTED   Streptococcus species NOT DETECTED NOT DETECTED   Streptococcus agalactiae NOT DETECTED NOT DETECTED   Streptococcus pneumoniae NOT DETECTED NOT DETECTED   Streptococcus pyogenes NOT DETECTED NOT DETECTED   Acinetobacter baumannii NOT DETECTED NOT DETECTED   Enterobacteriaceae species NOT DETECTED NOT DETECTED   Enterobacter cloacae complex NOT DETECTED NOT DETECTED   Escherichia coli NOT DETECTED NOT DETECTED   Klebsiella oxytoca NOT DETECTED NOT DETECTED   Klebsiella pneumoniae NOT DETECTED NOT DETECTED   Proteus species NOT DETECTED NOT DETECTED   Serratia marcescens NOT DETECTED NOT DETECTED   Haemophilus influenzae NOT DETECTED NOT DETECTED   Neisseria meningitidis NOT DETECTED NOT DETECTED   Pseudomonas aeruginosa NOT DETECTED NOT DETECTED   Candida albicans NOT DETECTED NOT DETECTED   Candida glabrata NOT DETECTED NOT DETECTED   Candida krusei NOT DETECTED NOT DETECTED   Candida parapsilosis NOT  DETECTED NOT DETECTED   Candida tropicalis NOT DETECTED NOT DETECTED    William Nolan 05/06/2017  8:39 PM

## 2017-05-06 NOTE — Progress Notes (Signed)
  Heart Rate was too high (between 130-140 bpm).  Nurse was notified that the rate was too high to do the echo.  Will attempt later.
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
## 2017-05-06 NOTE — Procedures (Signed)
Central Venous Catheter Insertion Procedure Note William Nolan [id number] [birth year]  Procedure: Insertion of Central Venous Catheter Indications: Assessment of intravascular volume, Drug and/or fluid administration and Frequent blood sampling  Procedure Details Consent: Unable to obtain consent because of Unable to reach family, patient intubated, and medical need of line placement . Time Out: Verified patient identification, verified procedure, site/side was marked, verified correct patient position, special equipment/implants available, medications/allergies/relevent history reviewed, required imaging and test results available.  Performed  Maximum sterile technique was used including antiseptics, cap, gloves, gown, hand hygiene, mask and sheet. Skin prep: Chlorhexidine; local anesthetic administered A antimicrobial bonded/coated triple lumen catheter was placed in the right internal jugular vein using the Seldinger technique.  Evaluation Blood flow good Complications: No apparent complications Patient did tolerate procedure well. Chest X-ray ordered to verify placement.  CXR: pending.  Hayden Pedro, AGACNP-BC [facility]  Pgr: [phone]  PCCM Pgr: [phone]
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
## 2017-05-07 ENCOUNTER — Inpatient Hospital Stay (HOSPITAL_COMMUNITY): Payer: Medicare Other

## 2017-05-07 DIAGNOSIS — I5030 Unspecified diastolic (congestive) heart failure: Secondary | ICD-10-CM

## 2017-05-07 DIAGNOSIS — L8990 Pressure ulcer of unspecified site, unspecified stage: Secondary | ICD-10-CM | POA: Diagnosis present

## 2017-05-07 DIAGNOSIS — G9340 Encephalopathy, unspecified: Secondary | ICD-10-CM

## 2017-05-07 LAB — CBC
HCT: 30.5 % — ABNORMAL LOW (ref 39.0–52.0)
Hemoglobin: 9.1 g/dL — ABNORMAL LOW (ref 13.0–17.0)
MCH: 27.7 pg (ref 26.0–34.0)
MCHC: 29.8 g/dL — ABNORMAL LOW (ref 30.0–36.0)
MCV: 93 fL (ref 78.0–100.0)
PLATELETS: 110 10*3/uL — AB (ref 150–400)
RBC: 3.28 MIL/uL — ABNORMAL LOW (ref 4.22–5.81)
RDW: 16 % — AB (ref 11.5–15.5)
WBC: 16.2 10*3/uL — AB (ref 4.0–10.5)

## 2017-05-07 LAB — BASIC METABOLIC PANEL
Anion gap: 12 (ref 5–15)
Anion gap: 10 (ref 5–15)
BUN: 82 mg/dL — AB (ref 6–20)
BUN: 78 mg/dL — ABNORMAL HIGH (ref 6–20)
CALCIUM: 7.6 mg/dL — AB (ref 8.9–10.3)
CHLORIDE: 121 mmol/L — AB (ref 101–111)
CO2: 22 mmol/L (ref 22–32)
CO2: 25 mmol/L (ref 22–32)
CREATININE: 2.26 mg/dL — AB (ref 0.61–1.24)
CREATININE: 2.01 mg/dL — AB (ref 0.61–1.24)
Calcium: 7.6 mg/dL — ABNORMAL LOW (ref 8.9–10.3)
Chloride: 120 mmol/L — ABNORMAL HIGH (ref 101–111)
GFR calc Af Amer: 33 mL/min — ABNORMAL LOW (ref >60)
GFR calc non Af Amer: 28 mL/min — ABNORMAL LOW (ref >60)
GFR calc non Af Amer: 32 mL/min — ABNORMAL LOW (ref >60)
GFR, EST AFRICAN AMERICAN: 38 mL/min — AB (ref >60)
GLUCOSE: 200 mg/dL — AB (ref 65–99)
Glucose, Bld: 215 mg/dL — ABNORMAL HIGH (ref 65–99)
POTASSIUM: 3.6 mmol/L (ref 3.5–5.1)
Potassium: 3.2 mmol/L — ABNORMAL LOW (ref 3.5–5.1)
SODIUM: 155 mmol/L — AB (ref 135–145)
Sodium: 155 mmol/L — ABNORMAL HIGH (ref 135–145)

## 2017-05-07 LAB — GLUCOSE, CAPILLARY
GLUCOSE-CAPILLARY: 185 mg/dL — AB (ref 65–99)
GLUCOSE-CAPILLARY: 195 mg/dL — AB (ref 65–99)
GLUCOSE-CAPILLARY: 109 mg/dL — AB (ref 65–99)
Glucose-Capillary: 100 mg/dL — ABNORMAL HIGH (ref 65–99)
Glucose-Capillary: 137 mg/dL — ABNORMAL HIGH (ref 65–99)
Glucose-Capillary: 151 mg/dL — ABNORMAL HIGH (ref 65–99)

## 2017-05-07 LAB — URINE CULTURE

## 2017-05-07 LAB — ECHOCARDIOGRAM LIMITED
Height: 69 in
WEIGHTICAEL: 2084.67 [oz_av]

## 2017-05-07 LAB — LACTIC ACID, PLASMA: Lactic Acid, Venous: 2.3 mmol/L (ref 0.5–1.9)

## 2017-05-07 LAB — LIPID PANEL
Cholesterol: 79 mg/dL (ref 0–200)
HDL: 13 mg/dL — AB (ref >40)
LDL CALC: 36 mg/dL (ref 0–99)
Total CHOL/HDL Ratio: 6.1 RATIO
Triglycerides: 150 mg/dL — ABNORMAL HIGH (ref <150)
VLDL: 30 mg/dL (ref 0–40)

## 2017-05-07 LAB — HEMOGLOBIN A1C
HEMOGLOBIN A1C: 8.6 % — AB (ref 4.8–5.6)
Mean Plasma Glucose: 200.12 mg/dL

## 2017-05-07 LAB — PROCALCITONIN: PROCALCITONIN: 7.45 ng/mL

## 2017-05-07 MED ORDER — FREE WATER
200.0000 mL | Freq: Four times a day (QID) | Status: DC
  Administered 2017-05-08 – 2017-05-09 (×5): 200 mL

## 2017-05-07 MED ORDER — ASPIRIN 300 MG RE SUPP
300.0000 mg | Freq: Every day | RECTAL | Status: DC
  Administered 2017-05-07 – 2017-05-13 (×7): 300 mg via RECTAL
  Filled 2017-05-07 (×9): qty 1

## 2017-05-07 MED ORDER — ORAL CARE MOUTH RINSE
15.0000 mL | Freq: Two times a day (BID) | OROMUCOSAL | Status: DC
  Administered 2017-05-07 – 2017-05-08 (×4): 15 mL via OROMUCOSAL

## 2017-05-07 MED ORDER — VANCOMYCIN HCL 500 MG IV SOLR
500.0000 mg | INTRAVENOUS | Status: DC
  Administered 2017-05-07 – 2017-05-08 (×2): 500 mg via INTRAVENOUS
  Filled 2017-05-07 (×2): qty 500

## 2017-05-07 MED FILL — Medication: Qty: 1 | Status: AC

## 2017-05-07 NOTE — Care Management (Signed)
CM received consult regarding locating family for [place] discussions - this barrier is managed by CSW - CSW consulted.

## 2017-05-07 NOTE — Progress Notes (Signed)
Echocardiogram 2D Echocardiogram limited has been perfomed. 05/07/2017, 10:35 AM

## 2017-05-07 NOTE — Progress Notes (Signed)
eLink Physician-Brief Progress Note Patient Name: William Nolan DOB: [birth year] MRN: [id number]   Date of Service  05/07/2017  HPI/Events of Note  Patient extubated while getting AM bath. Looks OK with sat = 95% and RR = 17.   eICU Interventions  Will ask ground team to evaluate him at bedside and will D/C Fentanyl IV PRN.      Intervention Category Major Interventions: Respiratory failure - evaluation and management  Sommer,[name] 05/07/2017, 6:09 AM

## 2017-05-07 NOTE — Progress Notes (Signed)
Initial Nutrition Assessment  DOCUMENTATION CODES:   Not applicable  INTERVENTION:    Await swallow evaluation/SLP recommendations  RD to follow for nutrition care plan   NUTRITION DIAGNOSIS:   Increased nutrient needs related to wound healing, chronic illness as evidenced by estimated needs  GOAL:   Patient will meet greater than or equal to 90% of their needs  MONITOR:   Diet advancement, PO intake, Supplement acceptance, Labs, Weight trends, Skin, I & O's  REASON FOR ASSESSMENT:   Consult, Low [place] Enteral/tube feeding initiation and management  ASSESSMENT:   66 y.o. Male PMH of prior CVA with Left sided deficits, HTN, HLD, and CAD; admitted for acute hypoxic event and fever.   Pt admitted from [facility]. At baseline he is bed bound and does not speak. Intubated for acute respiratory failure 12/8. Extubated this AM.  Hx of choking event PTA. Swallow evaluation pending. Labs and mediations reviewed. CBG's [phone].  Unable to complete Nutrition-Focused physical exam at this time.  Suspect some level of malnutrition.  Diet Order:  Diet NPO time specified  EDUCATION NEEDS:   No education needs have been identified at this time  Skin:  Skin Assessment: Skin Integrity Issues: Skin Integrity Issues:: Stage II Stage II: bilateral ischial tuberosities  Last BM:  12/10  Height:   Ht Readings from Last 1 Encounters:  05/06/17 5\' 9"  (1.753 m)   Weight:   Wt Readings from Last 1 Encounters:  05/07/17 130 lb 4.7 oz (59.1 kg)   Ideal Body Weight:  72.7 kg  BMI:  Body mass index is 19.24 kg/m.  Estimated Nutritional Needs:   Kcal:  1700-1900  Protein:  90-105 gm  Fluid:  1.7-1.9 L  Arthur Holms, RD, LDN Pager #: [phone] After-Hours Pager #: [phone]

## 2017-05-07 NOTE — Procedures (Signed)
History: 66 year old male being evaluated for altered mental status  Sedation: None  Technique: This is a 21 channel routine scalp EEG performed at the bedside with bipolar and monopolar montages arranged in accordance to the international 10/20 system of electrode placement. One channel was dedicated to EKG recording.    Background: There is prominent focal slowing, theta range as well as delta maximal in the right temporal region with phase reversal at T4.  This is rhythmic at times, but without evolution.  In addition, there is mild generalized irregular delta activity as well.  Photic stimulation: Physiologic driving is not performed  EEG Abnormalities: 1) focal right hemispheric slowing 2) generalized irregular slow activity 3) absent PDR  Clinical Interpretation: This EEG is consistent with a focal right hemispheric dysfunction as well as evidence of a more generalized nonspecific cerebral dysfunction (encephalopathy).  The focal dysfunction seen is nonspecific, but can be seen in structural brain injury, stroke, among other causes.  There was no seizure or seizure predisposition recorded on this study. Please note that a normal EEG does not preclude the possibility of epilepsy.   Roland Rack, MD [facility] [phone]  If 7pm- 7am, please page neurology on call as listed in [place].

## 2017-05-07 NOTE — Progress Notes (Signed)
PT Cancellation Note  Patient Details Name: William Nolan MRN: [id number] DOB: 13-Aug-1950   Cancelled Treatment:    Reason Eval/Treat Not Completed: Patient not medically ready, EEG   Duncan Dull 05/07/2017, 3:49 PM

## 2017-05-07 NOTE — Progress Notes (Signed)
EEG completed, results pending. 

## 2017-05-07 NOTE — Progress Notes (Signed)
RT NOTE:  RT called to bedside to advance ETT back after patient bath. Upon arrival ETT was completely out. ETT pulled during bath while patient was being rolled side to side. Vent circuit was pulled tight beneath bedside rail. RT placed patient on 2L [place], tolerating well at this time. Vitals: HR 112, RR 18, SpO2 97%. RN paged Warren Lacy for further instruction. NP will come to assess @ bedside. RT will monitor.

## 2017-05-07 NOTE — Progress Notes (Signed)
OT Cancellation Note  Patient Details Name: William Nolan MRN: [id number] DOB: April 15, 1951   Cancelled Treatment:    Reason Eval/Treat Not Completed: Patient at procedure or test/ unavailable(Having EEG)  [place], OR/L  [phone] 05/07/2017 05/07/2017, 1:49 PM

## 2017-05-07 NOTE — Consult Note (Signed)
[name] Nurse wound consult note Reason for Consult: Stage 2 PrIs to the bilateral ischial tuberosities, DTPI to the coccyx.  There is a callous to the left foot great toe and an abrasion to the left hip with microscopic breaks in the integument. Wound type: Pressure, trauma Pressure Injury POA: Yes Measurement: left great toe callous:  2cm x 1.5cm area of dried, intact callous that is lifting at the distal edge. Left hip: 4cm x 2.5cm area of abrasion IT and coccyx area:  7cm x 9cm area with evidence of previous wound healing within; two Stage 2 pressure injuries measuring 1cm x 2cm x 0.2cm noted over the bilateral ischial tuberosities. A purple/maroon discoloration over the coccyx measures 2cm x 2.5cm. No break in the skin. Wound bed:As described above Drainage (amount, consistency, odor): none Periwound:intact, dry Dressing procedure/placement/frequency: Patient is on a therapeutic mattress with low air loss feature and I have ordered the same when he is discharged to the floor.  Prevalon boots obtained yesterday will prevent crossing of LEs at ankles.Topical care of callous will be to paint with a betadine swabstick and allow to air-dry.  Silicone foam dressings will be ordered to cover the left hip abrasion and the coccyx and bilateral IT pressure injuries.  William Nolan nursing team will not follow, but will remain available to this patient, the nursing and medical teams.  Please re-consult if needed. Thanks, William Flakes, MSN, RN, [name], William Nolan  Pager# [phone]
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
## 2017-05-07 NOTE — Progress Notes (Signed)
Pt desat to 36s and Dr. Lamonte Sakai made aware. NT suction and chest PT completed per order with sats back up to 90s.
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
## 2017-05-07 NOTE — Progress Notes (Signed)
SLP Cancellation Note  Patient Details Name: Finneus Kaneshiro MRN: [id number] DOB: 05/26/1951   Cancelled treatment:       Reason Eval/Treat Not Completed: Medical issues which prohibited therapy. RN reports that pt just had some desaturations requiring NT suction and chest PT, and says he does not have much ability to cough. Will hold swallow eval for now, but will continue to follow for readiness to participate in testing.   Germain Osgood 05/07/2017, 11:44 AM  Germain Osgood, M.A. CCC-SLP [phone]

## 2017-05-07 NOTE — Progress Notes (Signed)
Inpatient Diabetes Program Recommendations  AACE/ADA: New Consensus Statement on Inpatient Glycemic Control (2015)  Target Ranges:  Prepandial:   less than 140 mg/dL      Peak postprandial:   less than 180 mg/dL (1-2 hours)      Critically ill patients:  140 - 180 mg/dL   Results for William Nolan, William Nolan (MRN [id number]) as of 05/07/2017 09:32  Ref. Range 05/06/2017 08:17 05/06/2017 11:27 05/06/2017 15:19 05/06/2017 20:01 05/06/2017 23:56 05/07/2017 03:46 05/07/2017 08:41  Glucose-Capillary Latest Ref Range: 65 - 99 mg/dL 255 (H) 165 (H) 104 (H) 224 (H) 233 (H) 185 (H) 195 (H)   Review of Glycemic Control  Diabetes history: DM2 Outpatient Diabetes medications: Levemir 12 units QHS, Novolog 2-8 units TID with meals Current orders for Inpatient glycemic control: Novolog 0-9 units Q4H, Novolog 3 units Q4H for tube feeding coverage  Inpatient Diabetes Program Recommendations:  Insulin - Basal: Please consider ordering Levemir 10 units Q24H.  Insulin - Meal Coverage: Currently ordered Novolog 3 units Q4H for tube feeding coverage. Noted patient was extubated this morning. If patient is no longer receiving tube feeding, please consider discontinuing Novolog tube feeding coverage.  Thanks, Barnie Alderman, RN, MSN, CDE Diabetes Coordinator Inpatient Diabetes Program [phone] (Team Pager from 8am to 5pm)

## 2017-05-08 ENCOUNTER — Ambulatory Visit (HOSPITAL_COMMUNITY): Payer: Self-pay

## 2017-05-08 ENCOUNTER — Inpatient Hospital Stay (HOSPITAL_COMMUNITY): Payer: Medicare Other

## 2017-05-08 DIAGNOSIS — J9601 Acute respiratory failure with hypoxia: Secondary | ICD-10-CM | POA: Diagnosis present

## 2017-05-08 DIAGNOSIS — J9602 Acute respiratory failure with hypercapnia: Secondary | ICD-10-CM

## 2017-05-08 LAB — BASIC METABOLIC PANEL
ANION GAP: 9 (ref 5–15)
ANION GAP: 11 (ref 5–15)
BUN: 54 mg/dL — ABNORMAL HIGH (ref 6–20)
BUN: 49 mg/dL — ABNORMAL HIGH (ref 6–20)
CALCIUM: 7.9 mg/dL — AB (ref 8.9–10.3)
CALCIUM: 7.7 mg/dL — AB (ref 8.9–10.3)
CO2: 25 mmol/L (ref 22–32)
CO2: 23 mmol/L (ref 22–32)
Chloride: 122 mmol/L — ABNORMAL HIGH (ref 101–111)
Chloride: 120 mmol/L — ABNORMAL HIGH (ref 101–111)
Creatinine, Ser: 1.61 mg/dL — ABNORMAL HIGH (ref 0.61–1.24)
Creatinine, Ser: 1.61 mg/dL — ABNORMAL HIGH (ref 0.61–1.24)
GFR calc Af Amer: 49 mL/min — ABNORMAL LOW (ref >60)
GFR calc non Af Amer: 42 mL/min — ABNORMAL LOW (ref >60)
GFR, EST AFRICAN AMERICAN: 49 mL/min — AB (ref >60)
GFR, EST NON AFRICAN AMERICAN: 42 mL/min — AB (ref >60)
GLUCOSE: 148 mg/dL — AB (ref 65–99)
Glucose, Bld: 260 mg/dL — ABNORMAL HIGH (ref 65–99)
POTASSIUM: 2.9 mmol/L — AB (ref 3.5–5.1)
Potassium: 4.2 mmol/L (ref 3.5–5.1)
SODIUM: 156 mmol/L — AB (ref 135–145)
Sodium: 154 mmol/L — ABNORMAL HIGH (ref 135–145)

## 2017-05-08 LAB — CBC
HCT: 33.4 % — ABNORMAL LOW (ref 39.0–52.0)
Hemoglobin: 10.2 g/dL — ABNORMAL LOW (ref 13.0–17.0)
MCH: 28.6 pg (ref 26.0–34.0)
MCHC: 30.5 g/dL (ref 30.0–36.0)
MCV: 93.6 fL (ref 78.0–100.0)
PLATELETS: 95 10*3/uL — AB (ref 150–400)
RBC: 3.57 MIL/uL — AB (ref 4.22–5.81)
RDW: 16.4 % — ABNORMAL HIGH (ref 11.5–15.5)
WBC: 16.7 10*3/uL — AB (ref 4.0–10.5)

## 2017-05-08 LAB — GLUCOSE, CAPILLARY
GLUCOSE-CAPILLARY: 139 mg/dL — AB (ref 65–99)
GLUCOSE-CAPILLARY: 215 mg/dL — AB (ref 65–99)
GLUCOSE-CAPILLARY: 258 mg/dL — AB (ref 65–99)
Glucose-Capillary: 127 mg/dL — ABNORMAL HIGH (ref 65–99)
Glucose-Capillary: 146 mg/dL — ABNORMAL HIGH (ref 65–99)

## 2017-05-08 LAB — CULTURE, BLOOD (ROUTINE X 2)

## 2017-05-08 LAB — URINE CULTURE: Culture: NO GROWTH

## 2017-05-08 MED ORDER — PRO-STAT SUGAR FREE PO LIQD
30.0000 mL | Freq: Every day | ORAL | Status: DC
  Administered 2017-05-08 – 2017-05-15 (×8): 30 mL
  Filled 2017-05-08 (×7): qty 30

## 2017-05-08 MED ORDER — DEXTROSE 5 % IV SOLN
INTRAVENOUS | Status: DC
  Administered 2017-05-08: 14:00:00 via INTRAVENOUS

## 2017-05-08 MED ORDER — CHLORHEXIDINE GLUCONATE 0.12 % MT SOLN
15.0000 mL | Freq: Two times a day (BID) | OROMUCOSAL | Status: DC
  Administered 2017-05-08 – 2017-05-23 (×30): 15 mL via OROMUCOSAL
  Filled 2017-05-08 (×28): qty 15

## 2017-05-08 MED ORDER — VANCOMYCIN HCL IN DEXTROSE 750-5 MG/150ML-% IV SOLN
750.0000 mg | INTRAVENOUS | Status: AC
  Administered 2017-05-09 – 2017-05-11 (×3): 750 mg via INTRAVENOUS
  Filled 2017-05-08 (×3): qty 150

## 2017-05-08 MED ORDER — JEVITY 1.2 CAL PO LIQD
1000.0000 mL | ORAL | Status: DC
  Administered 2017-05-08 – 2017-05-13 (×7): 1000 mL
  Filled 2017-05-08 (×14): qty 1000

## 2017-05-08 MED ORDER — POTASSIUM CHLORIDE 20 MEQ/15ML (10%) PO SOLN
40.0000 meq | Freq: Once | ORAL | Status: AC
  Administered 2017-05-08: 40 meq
  Filled 2017-05-08: qty 30

## 2017-05-08 MED ORDER — ORAL CARE MOUTH RINSE
15.0000 mL | Freq: Two times a day (BID) | OROMUCOSAL | Status: DC
  Administered 2017-05-08 – 2017-05-23 (×29): 15 mL via OROMUCOSAL

## 2017-05-08 MED ORDER — ACETAMINOPHEN 160 MG/5ML PO SOLN: 650.0000 mg | Freq: Four times a day (QID) | ORAL | Status: DC | PRN

## 2017-05-08 MED ORDER — POTASSIUM CHLORIDE 10 MEQ/50ML IV SOLN
10.0000 meq | INTRAVENOUS | Status: AC
  Administered 2017-05-08 (×5): 10 meq via INTRAVENOUS
  Filled 2017-05-08 (×5): qty 50

## 2017-05-08 NOTE — Progress Notes (Signed)
Pharmacy Antibiotic Note  William Nolan is a 66 y.o. male admitted on 05/05/2017 from NH with SOB/VDRF/PNA/sepsis.  Pharmacy has been consulted for .Vancomycin and Zosyn  Dosing.    SCr continuing to improve, currently 1.61. Est CrCl ~ 35 mL/min. Good UOP 1.6 ml/kg/hr.  WBC increased (16.7), PCT is trending up (7.45), LA trending down.  Plan: Increase Vancomycin to 750mg  IV every 24 hours.  Continue Zosyn 3.375 g IV q8h  F/U renal function, culture results, and clinical status.   Height: 5\' 9"  (175.3 cm) Weight: 125 lb 3.5 oz (56.8 kg) IBW/kg (Calculated) : 70.7  Temp (24hrs), Avg:98.1 F (36.7 C), Min:97.5 F (36.4 C), Max:98.5 F (36.9 C)  Recent Labs  Lab 05/05/17 2138  05/05/17 2144 05/05/17 2333 05/06/17 0200 05/06/17 0600 05/06/17 1036  05/06/17 1505 05/06/17 2305 05/07/17 0400 05/07/17 0401 05/07/17 0815 05/08/17 0454  WBC  --   --  21.0*  --   --  19.1*  --   --   --   --   --  16.2*  --  16.7*  CREATININE  --    < > 3.48*  --  2.64*  --   --    < > 2.41* 2.41* 2.26*  --  2.01* 1.61*  LATICACIDVEN 9.93*  --   --  8.1* 5.8*  --  3.7*  --   --   --  2.3*  --   --   --    < > = values in this interval not displayed.    Estimated Creatinine Clearance: 35.3 mL/min (A) (by C-G formula based on SCr of 1.61 mg/dL (H)).    Allergies  Allergen Reactions  . Heparin     HIT panel pending - if HIT Ab negative, discontinue Heparin allergy    Zosyn 12/8>> Vancomycin 12/8>>  12/8 blood x 2 12/9 BCID: 1/2 CoNS 12/8 UCx - multiple species, suggest recollection 12/9 MRSA pcr negative 12/9 TA >> [name] 05/08/2017 2:37 PM
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
## 2017-05-08 NOTE — Progress Notes (Signed)
PSLP Cancellation Note  Patient Details Name: Trase Bunda MRN: [id number] DOB: [birth year]    Cancelled treatment:       Reason Eval/Treat Not Completed: Medical issues which prohibited therapy - pt is still requiring NT suction. RN was attempting to place an NGT this morning upon SLP arrival and pt was making minimal attempts to swallow. SLP will follow for readiness.   Germain Osgood 05/08/2017, 9:38 AM  Germain Osgood, M.A. CCC-SLP [phone]

## 2017-05-08 NOTE — Evaluation (Signed)
Occupational Therapy Evaluation Patient Details Name: William Nolan MRN: [id number] DOB: [birth year] Today's Date: 05/08/2017    History of Present Illness 66 y.o.malePMH of prior CVAwith Left sided deficits, HTN, HLD,and CADadmitted for Acute hypoxic event and fever. Work up revealed punctate acute/early subacute infarction within the left posterolateral parietal lobe, Extensive chronic infarction of the right greater than left cerebral hemispheres in both MCA and PCA territories   Clinical Impression   Pt admitted with above. He demonstrates the below listed deficits and will benefit from continued OT to maximize safety and independence with BADLs.  Pt presents with Lt UE flexor contractures, as well as swan neck deformities Rt hand.  He is unable to use either UE functionally.  He has significant impairments in communication, so assessing cognition is very difficult.  He will attempt to assist with PROM/AAROM on command.   He tolerated ROM of both UEs and hands well, but demonstrated significant groaning, and withdrawal when attempting to clean Rt hand.  A palm protector was placed in Lt hand and nsg was instructed in initial wear schedule - will monitor his tolerance.  He would likely benefit from a resting hand splint for Rt UE, however, am unsure how well he will tolerate it.  Will monitor for Rt splinting needs and timing of such.   He apparently is a long term resident of NH, and appears he has been total A for some time, however, unable to confirm this.   He would benefit from at least an OT eval once he returns to SNF to ensure best positioning, carry over of splinting, and education with ngs staff for ROM and splinting needs at SNF.   Will follow acutely for splinting/positioning needs.  .      Follow Up Recommendations  SNF    Equipment Recommendations  None recommended by OT    Recommendations for Other Services       Precautions / Restrictions Precautions Precautions:  Fall Precaution Comments: NG tube in place      Mobility Bed Mobility Overal bed mobility: Needs Assistance Bed Mobility: Rolling Rolling: Total assist   Supine to sit: Total assist        Transfers                 General transfer comment: unable to attempt due to contractures     Balance                                           ADL either performed or assessed with clinical judgement   ADL Overall ADL's : Needs assistance/impaired                                       General ADL Comments: total A for all.  Pt unable to assist      Vision   Additional Comments: Pt will track therapist Lt to Rt      Perception     Praxis      Pertinent Vitals/Pain Pain Assessment: Faces Faces Pain Scale: Hurts little more Pain Location: Lt hand when attempting to clean palm  Pain Descriptors / Indicators: Grimacing Pain Intervention(s): Monitored during session;Repositioned     Hand Dominance (unknown )   Extremity/Trunk Assessment Upper Extremity Assessment Upper Extremity Assessment: RUE deficits/detail;LUE deficits/detail RUE  Deficits / Details: Pt with arthritic appearing deformities.  Swan neck deformity noted digits 3-5.   Passive flexion and extension WFL once deformities corrected.  very minimal active movement noted.   PROM elbow WFL; shoulder ~80* passive flexion  RUE Coordination: decreased fine motor;decreased gross motor LUE Deficits / Details: severe flexion contractures noted throughout.  Pt with very minimal passive elbow extension.   Unable to apply splint due to severity of contracture.   Pt tolerated finger PROM, but grimmaced and moaned when hand was cleaned.  Hand fisted  LUE Coordination: decreased fine motor;decreased gross motor   Lower Extremity Assessment Lower Extremity Assessment: Defer to PT evaluation   Cervical / Trunk Assessment Cervical / Trunk Assessment: Kyphotic   Communication  Communication Communication: Expressive difficulties;Receptive difficulties   Cognition Arousal/Alertness: Awake/alert   Overall Cognitive Status: Difficult to assess                                 General Comments: Pt with impaired cognition, as well as contractures making it very difficult to accurately asses his level of cognition.  He will actively attempt to assist with ROM on command    General Comments  palm protector applied to Lt hand.  RN informed and recommended it be removed for 4 hours each shift, and to clean hand each shift    Exercises     Shoulder Instructions      Home Living Family/patient expects to be discharged to:: Skilled nursing facility                                        Prior Functioning/Environment Level of Independence: Needs assistance  Gait / Transfers Assistance Needed: appears bed bound with noted full body contractures and arthritic changes ADL's / Homemaking Assistance Needed: per chart, pt is total feed    Comments: Pt unable to provide info and per chart, SW having difficulty determing which facility pt is from with certainty         OT Problem List: Decreased range of motion;Decreased strength;Impaired UE functional use      OT Treatment/Interventions: Splinting;Patient/family education    OT Goals(Current goals can be found in the care plan section) Acute Rehab OT Goals OT Goal Formulation: Patient unable to participate in goal setting Time For Goal Achievement: 05/22/17 Potential to Achieve Goals: Fair ADL Goals Additional ADL Goal #1: Pt will tolerate palm protector to Lt hand Additional ADL Goal #2: Pt will tolerate Resting hand splint Rt hand per established schedule  OT Frequency: Min 2X/week   Barriers to D/C: Decreased caregiver support          Co-evaluation              AM-PAC PT "6 Clicks" Daily Activity     Outcome Measure Help from another person eating meals?: Total Help  from another person taking care of personal grooming?: Total Help from another person toileting, which includes using toliet, bedpan, or urinal?: Total Help from another person bathing (including washing, rinsing, drying)?: Total Help from another person to put on and taking off regular upper body clothing?: Total Help from another person to put on and taking off regular lower body clothing?: Total 6 Click Score: 6   End of Session Nurse Communication: Other (comment)(palm protector )  Activity Tolerance: Patient tolerated treatment well  Patient left: in bed  OT Visit Diagnosis: Muscle weakness (generalized) (M62.81);Hemiplegia and hemiparesis Hemiplegia - Right/Left: Left Hemiplegia - caused by: Cerebral infarction                Time: 8675-4492 OT Time Calculation (min): 20 min Charges:  OT General Charges $OT Visit: 1 Visit OT Evaluation $OT Eval Moderate Complexity: 1 Mod G-Codes:     [facility], OTR/L [phone]   Lucille Passy M 05/08/2017, 6:37 PM

## 2017-05-08 NOTE — Evaluation (Signed)
Physical Therapy Evaluation Patient Details Name: William Nolan MRN: [id number] DOB: [birth year] Today's Date: 05/08/2017   History of Present Illness  66 y.o.malePMH of prior CVAwith Left sided deficits, HTN, HLD,and CADadmitted for Acute hypoxic event and fever. Work up revealed punctate acute/early subacute infarction within the left posterolateral parietal lobe, Extensive chronic infarction of the right greater than left cerebral hemispheres in both MCA and PCA territories  Clinical Impression  Orders received for PT evaluation. Patient demonstrates significant deficits in functional mobility as indicated below. Based on chart review and patient presentation, appears patient is dependent for all care at baseline. Do not feel patient is appropriate for Skilled PT therapies at this time, recommend ROM activities daily and positioning by staff. No further acute PT needs, will defer patient back to SNF.      Follow Up Recommendations SNF(return to SNF)    Equipment Recommendations  None recommended by PT    Recommendations for Other Services       Precautions / Restrictions Precautions Precautions: Fall Precaution Comments: NG tube in place      Mobility  Bed Mobility Overal bed mobility: Needs Assistance Bed Mobility: Rolling;Supine to Sit Rolling: Total assist   Supine to sit: Total assist     General bed mobility comments: attempted EOB however, unable to complete despite total assist due to contracted postures  Transfers                    Ambulation/Gait                Stairs            Wheelchair Mobility    Modified Rankin (Stroke Patients Only) Modified Rankin (Stroke Patients Only) Pre-Morbid Rankin Score: Severe disability Modified Rankin: Severe disability     Balance Overall balance assessment: (unable to assess total assist)                                           Pertinent Vitals/Pain Pain Assessment:  Faces Faces Pain Scale: Hurts little more Pain Location: when attempting PROM Pain Descriptors / Indicators: Grimacing Pain Intervention(s): Monitored during session    Home Living Family/patient expects to be discharged to:: Skilled nursing facility                      Prior Function Level of Independence: Needs assistance   Gait / Transfers Assistance Needed: appears bed bound with noted full body contractures and arthritic changes     Comments: pt unable to provide any history     Hand Dominance        Extremity/Trunk Assessment   Upper Extremity Assessment Upper Extremity Assessment: Defer to OT evaluation    Lower Extremity Assessment Lower Extremity Assessment: RLE deficits/detail;LLE deficits/detail;Difficult to assess due to impaired cognition RLE Deficits / Details: noted Wound outside of right heel, noted contractures and arthritic changes  RLE Coordination: decreased fine motor;decreased gross motor LLE Deficits / Details: noted contractures and arthiritic changes LLE Coordination: decreased fine motor;decreased gross motor    Cervical / Trunk Assessment Cervical / Trunk Assessment: Kyphotic  Communication   Communication: Expressive difficulties;Receptive difficulties  Cognition Arousal/Alertness: Awake/alert   Overall Cognitive Status: Difficult to assess  General Comments      Exercises     Assessment/Plan    PT Assessment Patent does not need any further PT services  PT Problem List         PT Treatment Interventions      PT Goals (Current goals can be found in the Care Plan section)  Acute Rehab PT Goals PT Goal Formulation: All assessment and education complete, DC therapy    Frequency     Barriers to discharge        Co-evaluation               AM-PAC PT "6 Clicks" Daily Activity  Outcome Measure Difficulty turning over in bed (including adjusting  bedclothes, sheets and blankets)?: Unable Difficulty moving from lying on back to sitting on the side of the bed? : Unable Difficulty sitting down on and standing up from a chair with arms (e.g., wheelchair, bedside commode, etc,.)?: Unable Help needed moving to and from a bed to chair (including a wheelchair)?: Total Help needed walking in hospital room?: Total Help needed climbing 3-5 steps with a railing? : Total 6 Click Score: 6    End of Session Equipment Utilized During Treatment: Oxygen   Patient left: in bed;with call bell/phone within reach;with bed alarm set;with SCD's reapplied Nurse Communication: Other (comment)(not appropriate for PT services) PT Visit Diagnosis: Other symptoms and signs involving the nervous system (R29.898)    Time: 2778-2423 PT Time Calculation (min) (ACUTE ONLY): 15 min   Charges:   PT Evaluation $PT Eval Moderate Complexity: 1 Mod     PT G Codes:        Alben Deeds, PT DPT  Board Certified Neurologic Specialist [place] 05/08/2017, 1:44 PM

## 2017-05-08 NOTE — Progress Notes (Signed)
Nutrition Follow-up  DOCUMENTATION CODES:   Severe malnutrition in context of chronic illness, Underweight  INTERVENTION:    Jevity 1.2 at 60 ml/h (1440 ml per day)  Pro-stat 30 ml once daily  Provides 1828 kcal, 95 gm protein, 1166 ml free water daily  NUTRITION DIAGNOSIS:   Severe Malnutrition related to chronic illness(stroke) as evidenced by severe fat depletion, severe muscle depletion.  Ongoing  GOAL:   Patient will meet greater than or equal to 90% of their needs  Unmet  MONITOR:   TF tolerance, Labs, Skin, I & O's  REASON FOR ASSESSMENT:   Consult Enteral/tube feeding initiation and management  ASSESSMENT:   66 y.o. Male PMH of prior CVA with Left sided deficits, HTN, HLD, and CAD; admitted for acute hypoxic event and fever.  Discussed patient in ICU rounds and with RN today. SLP following for ability to complete swallow evaluation. RN placed NGT this morning. Received MD Consult for TF initiation and management. Labs reviewed. Sodium 156 (H), potassium 2.9 (L) CBG's: 127-139 Medications reviewed and include KCl.   NUTRITION - FOCUSED PHYSICAL EXAM:    Most Recent Value  Orbital Region  Severe depletion  Upper Arm Region  Unable to assess  Thoracic and Lumbar Region  Unable to assess  Buccal Region  Severe depletion  Temple Region  Severe depletion  Clavicle Bone Region  Severe depletion  Clavicle and Acromion Bone Region  Severe depletion  Scapular Bone Region  Unable to assess  Dorsal Hand  Unable to assess  Patellar Region  Severe depletion  Anterior Thigh Region  Severe depletion  Posterior Calf Region  Severe depletion  Edema (RD Assessment)  None  Hair  Reviewed  Eyes  Reviewed  Mouth  Reviewed  Skin  Reviewed  Nails  Reviewed       Diet Order:  Diet NPO time specified  EDUCATION NEEDS:   No education needs have been identified at this time  Skin:  Skin Assessment: Skin Integrity Issues: Skin Integrity Issues:: Stage  II Stage II: bilateral ischial tuberosities  Last BM:  12/11  Height:   Ht Readings from Last 1 Encounters:  05/06/17 5\' 9"  (1.753 m)    Weight:   Wt Readings from Last 1 Encounters:  05/08/17 125 lb 3.5 oz (56.8 kg)    Ideal Body Weight:  72.7 kg  BMI:  Body mass index is 18.49 kg/m.  Estimated Nutritional Needs:   Kcal:  1700-1900  Protein:  90-105 gm  Fluid:  1.7-1.9 L   Molli Barrows, RD, LDN, CNSC Pager [phone] After Hours Pager [phone]
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
## 2017-05-08 NOTE — Progress Notes (Signed)
CSW reached out to [place] at [facility] as CSW was made aware that pt is from [facility]. Helene Kelp is unsure if pt is from the facility and informed CSW that she would have to call CSW back to let CSW know if pt is a resident at [facility]. CSW continues to follow for discharge needs at this time.    Virgie Dad Irfan Veal, MSW, [facility] Clinical Social Worker [phone]

## 2017-05-09 ENCOUNTER — Other Ambulatory Visit (HOSPITAL_COMMUNITY): Payer: Medicare Other

## 2017-05-09 ENCOUNTER — Inpatient Hospital Stay (HOSPITAL_COMMUNITY): Payer: Medicare Other

## 2017-05-09 ENCOUNTER — Ambulatory Visit (HOSPITAL_COMMUNITY): Payer: Medicare Other

## 2017-05-09 DIAGNOSIS — Z515 Encounter for palliative care: Secondary | ICD-10-CM

## 2017-05-09 LAB — CBC
HEMATOCRIT: 33.7 % — AB (ref 39.0–52.0)
HEMOGLOBIN: 10 g/dL — AB (ref 13.0–17.0)
MCH: 27.4 pg (ref 26.0–34.0)
MCHC: 29.7 g/dL — ABNORMAL LOW (ref 30.0–36.0)
MCV: 92.3 fL (ref 78.0–100.0)
Platelets: 108 10*3/uL — ABNORMAL LOW (ref 150–400)
RBC: 3.65 MIL/uL — ABNORMAL LOW (ref 4.22–5.81)
RDW: 16.2 % — AB (ref 11.5–15.5)
WBC: 16.1 10*3/uL — AB (ref 4.0–10.5)

## 2017-05-09 LAB — GLUCOSE, CAPILLARY
GLUCOSE-CAPILLARY: 133 mg/dL — AB (ref 65–99)
GLUCOSE-CAPILLARY: 210 mg/dL — AB (ref 65–99)
GLUCOSE-CAPILLARY: 248 mg/dL — AB (ref 65–99)
GLUCOSE-CAPILLARY: 281 mg/dL — AB (ref 65–99)
Glucose-Capillary: 182 mg/dL — ABNORMAL HIGH (ref 65–99)
Glucose-Capillary: 221 mg/dL — ABNORMAL HIGH (ref 65–99)
Glucose-Capillary: 196 mg/dL — ABNORMAL HIGH (ref 65–99)

## 2017-05-09 LAB — CULTURE, RESPIRATORY W GRAM STAIN: Culture: NORMAL

## 2017-05-09 LAB — BASIC METABOLIC PANEL
ANION GAP: 8 (ref 5–15)
BUN: 43 mg/dL — AB (ref 6–20)
CO2: 26 mmol/L (ref 22–32)
Calcium: 7.9 mg/dL — ABNORMAL LOW (ref 8.9–10.3)
Chloride: 119 mmol/L — ABNORMAL HIGH (ref 101–111)
Creatinine, Ser: 1.39 mg/dL — ABNORMAL HIGH (ref 0.61–1.24)
GFR calc Af Amer: 59 mL/min — ABNORMAL LOW (ref >60)
GFR, EST NON AFRICAN AMERICAN: 51 mL/min — AB (ref >60)
Glucose, Bld: 297 mg/dL — ABNORMAL HIGH (ref 65–99)
POTASSIUM: 3.4 mmol/L — AB (ref 3.5–5.1)
SODIUM: 153 mmol/L — AB (ref 135–145)

## 2017-05-09 LAB — HEPARIN INDUCED PLATELET AB (HIT ANTIBODY): Heparin Induced Plt Ab: 0.433 OD — ABNORMAL HIGH (ref 0.000–0.400)

## 2017-05-09 LAB — PROCALCITONIN: Procalcitonin: 2.04 ng/mL

## 2017-05-09 MED ORDER — FREE WATER
200.0000 mL | Status: DC
  Administered 2017-05-09 – 2017-05-14 (×29): 200 mL

## 2017-05-09 MED ORDER — POTASSIUM CHLORIDE 20 MEQ/15ML (10%) PO SOLN
40.0000 meq | Freq: Three times a day (TID) | ORAL | Status: AC
  Administered 2017-05-09 (×2): 40 meq
  Filled 2017-05-09 (×3): qty 30
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
## 2017-05-09 NOTE — Progress Notes (Signed)
Cortrak Tube Team Note:  Consult received to place a Cortrak feeding tube.   A 10 F Cortrak tube was placed in the L nare and secured with a nasal bridle at 73 cm. Per the Cortrak monitor reading the tube tip is in the stomach. NGT was removed by RN once stomach placement was confirmed by reading on Cortrak machine screen and prior to bridle placement.  No x-ray is required. RN may begin using tube.     If the tube becomes dislodged please keep the tube and contact the Cortrak team at [email or web address] (password [email or web address]) for replacement.  If after hours and replacement cannot be delayed, place a NG tube and confirm placement with an abdominal x-ray.      Jarome Matin, MS, RD, LDN, [facility] In[facility] Clinical Dietitian Pager # [phone] After hours/weekend pager # [phone]
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
## 2017-05-09 NOTE — Progress Notes (Signed)
Occupational Therapy Treatment Patient Details Name: William Nolan MRN: [id number] DOB: 1951/02/04 Today's Date: 05/09/2017    History of present illness 66 y.o.malePMH of prior CVAwith Left sided deficits, HTN, HLD,and CADadmitted for Acute hypoxic event and fever. Work up revealed punctate acute/early subacute infarction within the left posterolateral parietal lobe, Extensive chronic infarction of the right greater than left cerebral hemispheres in both MCA and PCA territories   OT comments  Pt tolerating palm protector Lt hand, mild redness noted at wrist crease that dissipates after ~5 mins.  PROM to Lt hand and Rt hand performed.  Pt with restlessness with attempts at Rt hand ROM.  Will defer splinting Rt hand due to this and IV in Rt wrist, but will continue to monitor for appropriateness and needs.  Recommend palm protector on at all times and off 4 hours each shift.    Follow Up Recommendations  SNF    Equipment Recommendations  None recommended by OT    Recommendations for Other Services      Precautions / Restrictions Precautions Precautions: Fall Precaution Comments: NG tube in place       Mobility Bed Mobility                  Transfers                      Balance                                           ADL either performed or assessed with clinical judgement   ADL                                               Vision       Perception     Praxis      Cognition Arousal/Alertness: Awake/alert Behavior During Therapy: Flat affect Overall Cognitive Status: Difficult to assess                                 General Comments: Pt with impaired cognition, as well as contractures making it very difficult to accurately asses his level of cognition.  He will actively attempt to assist with ROM on command         Exercises Exercises: Other exercises Other Exercises Other Exercises:  Spoke with RN who reports pt has been tolerating palm protector without difficulty.  Palm protector removed, mild redness noted at wrist crease, but it dissipates within five minutes.  Pt tolerated gentle ROM of digits and replacement of palm protector.   Recommend wearing palm protector all times, but off for 4 hours each shift    Shoulder Instructions       General Comments      Pertinent Vitals/ Pain       Pain Assessment: Faces Faces Pain Scale: No hurt  Home Living                                          Prior Functioning/Environment  Frequency  Min 2X/week        Progress Toward Goals  OT Goals(current goals can now be found in the care plan section)        Plan Discharge plan remains appropriate    Co-evaluation                 AM-PAC PT "6 Clicks" Daily Activity     Outcome Measure   Help from another person eating meals?: Total Help from another person taking care of personal grooming?: Total Help from another person toileting, which includes using toliet, bedpan, or urinal?: Total Help from another person bathing (including washing, rinsing, drying)?: Total Help from another person to put on and taking off regular upper body clothing?: Total Help from another person to put on and taking off regular lower body clothing?: Total 6 Click Score: 6    End of Session Equipment Utilized During Treatment: Oxygen  OT Visit Diagnosis: Muscle weakness (generalized) (M62.81);Hemiplegia and hemiparesis Hemiplegia - Right/Left: Left Hemiplegia - caused by: Cerebral infarction   Activity Tolerance Patient tolerated treatment well   Patient Left in bed   Nurse Communication          Time: 6213-0865 OT Time Calculation (min): 11 min  Charges: OT General Charges $OT Visit: 1 Visit OT Treatments $Therapeutic Activity: 8-22 mins  [facility], OTR/L [phone]    Lucille Passy M 05/09/2017, 2:39 PM
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
## 2017-05-09 NOTE — Progress Notes (Signed)
Inpatient Diabetes Program Recommendations  AACE/ADA: New Consensus Statement on Inpatient Glycemic Control (2015)  Target Ranges:  Prepandial:   less than 140 mg/dL      Peak postprandial:   less than 180 mg/dL (1-2 hours)      Critically ill patients:  140 - 180 mg/dL   Lab Results  Component Value Date   GLUCAP 182 (H) 05/09/2017   HGBA1C 8.6 (H) 05/07/2017    Review of Glycemic ControlResults for EIDAN, MUELLNER (MRN [id number]) as of 05/09/2017 12:43  Ref. Range 05/08/2017 11:46 05/08/2017 15:34 05/08/2017 20:26 05/08/2017 23:39 05/09/2017 03:43  Glucose-Capillary Latest Ref Range: 65 - 99 mg/dL 139 (H) 146 (H) 258 (H) 215 (H) 182 (H)    Diabetes history: Type 2 DM Outpatient Diabetes medications: Novolog 2-8 units tid with meals, Levemir 12 units q HS Current orders for Inpatient glycemic control:  Novolog sensitive q 4 hours, Novolog 3 units q 4 hours  Inpatient Diabetes Program Recommendations: Please consider adding Levemir 10 units daily.    Thanks, Adah Perl, RN, BC-ADM Inpatient Diabetes Coordinator Pager [phone] (8a-5p)

## 2017-05-09 NOTE — Progress Notes (Addendum)
CSW spoke with Helene Kelp from [place] for further assistance in locating family members for pt at this time. CSW is awaiting response from [facility] admissions as she will let CSW know ASAP. CSW confirmed that pt is from their facility and can return once medically ready. CSW continues to follow for any discharge needs.      William Nolan, MSW, [place] [facility] Clinical Social Worker [phone]

## 2017-05-09 NOTE — Progress Notes (Signed)
CSW still attemtpting to gather information on family for pt at this time. CSW will continues to follow and update RN.      Virgie Dad Aevah Stansbery, MSW, [place] [facility] Clinical Social Worker [phone]

## 2017-05-09 NOTE — Progress Notes (Addendum)
Palliative Medicine RN Note: Consult order noted and chart reviewed. Per Dr Nelda Marseille and SW there is no family or decision maker available, therefore PMT is unable to have a goals of care meeting as requested. Please re-consult once family/decision-maker is either located or designated by the courts, or if palliative care is needed for other concerns.  Marjie Skiff Ardelia Wrede, RN, BSN, [facility] 05/09/2017 1:22 PM Cell [phone] 8:00-4:00 Monday-Friday Office [phone]

## 2017-05-09 NOTE — Progress Notes (Signed)
  Pt transferred to floor form 28M pt alert to self unable to assess orientation. Dur to expressive aphasia. BP 103/69 (BP Location: Right Arm)   Pulse 93   Temp 98 F (36.7 C)   Resp 16   Ht 5\' 9"  (1.753 m)   Wt 58.1 kg (128 lb 1.4 oz)   SpO2 100%   BMI 18.92 kg/m  call bell in place bed alarm on and bed in lowest position will continue to monitor and treat per MD orders

## 2017-05-10 DIAGNOSIS — R410 Disorientation, unspecified: Secondary | ICD-10-CM

## 2017-05-10 DIAGNOSIS — E1165 Type 2 diabetes mellitus with hyperglycemia: Secondary | ICD-10-CM

## 2017-05-10 DIAGNOSIS — E118 Type 2 diabetes mellitus with unspecified complications: Secondary | ICD-10-CM

## 2017-05-10 DIAGNOSIS — K769 Liver disease, unspecified: Secondary | ICD-10-CM

## 2017-05-10 DIAGNOSIS — I639 Cerebral infarction, unspecified: Secondary | ICD-10-CM

## 2017-05-10 DIAGNOSIS — D649 Anemia, unspecified: Secondary | ICD-10-CM

## 2017-05-10 DIAGNOSIS — D696 Thrombocytopenia, unspecified: Secondary | ICD-10-CM

## 2017-05-10 LAB — CBC WITH DIFFERENTIAL/PLATELET
Basophils Absolute: 0 10*3/uL (ref 0.0–0.1)
Basophils Relative: 0 %
EOS ABS: 0.3 10*3/uL (ref 0.0–0.7)
Eosinophils Relative: 2 %
HCT: 31.7 % — ABNORMAL LOW (ref 39.0–52.0)
HEMOGLOBIN: 9.6 g/dL — AB (ref 13.0–17.0)
LYMPHS PCT: 6 %
Lymphs Abs: 0.8 10*3/uL (ref 0.7–4.0)
MCH: 27.4 pg (ref 26.0–34.0)
MCHC: 30.3 g/dL (ref 30.0–36.0)
MCV: 90.3 fL (ref 78.0–100.0)
MONOS PCT: 3 %
Monocytes Absolute: 0.4 10*3/uL (ref 0.1–1.0)
NEUTROS ABS: 12.3 10*3/uL — AB (ref 1.7–7.7)
Neutrophils Relative %: 89 %
Platelets: 87 10*3/uL — ABNORMAL LOW (ref 150–400)
RBC: 3.51 MIL/uL — ABNORMAL LOW (ref 4.22–5.81)
RDW: 16 % — ABNORMAL HIGH (ref 11.5–15.5)
WBC: 13.8 10*3/uL — ABNORMAL HIGH (ref 4.0–10.5)

## 2017-05-10 LAB — IRON AND TIBC
IRON: 18 ug/dL — AB (ref 45–182)
SATURATION RATIOS: 16 % — AB (ref 17.9–39.5)
TIBC: 109 ug/dL — ABNORMAL LOW (ref 250–450)
UIBC: 91 ug/dL

## 2017-05-10 LAB — CBC
HEMATOCRIT: 33.1 % — AB (ref 39.0–52.0)
HEMOGLOBIN: 9.8 g/dL — AB (ref 13.0–17.0)
MCH: 27.1 pg (ref 26.0–34.0)
MCHC: 29.6 g/dL — AB (ref 30.0–36.0)
MCV: 91.7 fL (ref 78.0–100.0)
Platelets: 90 10*3/uL — ABNORMAL LOW (ref 150–400)
RBC: 3.61 MIL/uL — ABNORMAL LOW (ref 4.22–5.81)
RDW: 16.1 % — ABNORMAL HIGH (ref 11.5–15.5)
WBC: 12.7 10*3/uL — ABNORMAL HIGH (ref 4.0–10.5)

## 2017-05-10 LAB — FERRITIN: FERRITIN: 847 ng/mL — AB (ref 24–336)

## 2017-05-10 LAB — COMPREHENSIVE METABOLIC PANEL
ALBUMIN: 1.7 g/dL — AB (ref 3.5–5.0)
ALK PHOS: 418 U/L — AB (ref 38–126)
ALT: 37 U/L (ref 17–63)
ANION GAP: 7 (ref 5–15)
AST: 46 U/L — AB (ref 15–41)
BILIRUBIN TOTAL: 1.1 mg/dL (ref 0.3–1.2)
BUN: 21 mg/dL — AB (ref 6–20)
CALCIUM: 7.6 mg/dL — AB (ref 8.9–10.3)
CO2: 28 mmol/L (ref 22–32)
Chloride: 110 mmol/L (ref 101–111)
Creatinine, Ser: 1.1 mg/dL (ref 0.61–1.24)
GFR calc Af Amer: >60 mL/min (ref >60)
GFR calc non Af Amer: >60 mL/min (ref >60)
GLUCOSE: 355 mg/dL — AB (ref 65–99)
Potassium: 3 mmol/L — ABNORMAL LOW (ref 3.5–5.1)
SODIUM: 145 mmol/L (ref 135–145)
TOTAL PROTEIN: 5.2 g/dL — AB (ref 6.5–8.1)

## 2017-05-10 LAB — PROCALCITONIN: PROCALCITONIN: 1.56 ng/mL

## 2017-05-10 LAB — RETICULOCYTES
RBC.: 3.51 MIL/uL — ABNORMAL LOW (ref 4.22–5.81)
Retic Count, Absolute: 24.6 10*3/uL (ref 19.0–186.0)
Retic Ct Pct: 0.7 % (ref 0.4–3.1)

## 2017-05-10 LAB — GLUCOSE, CAPILLARY
Glucose-Capillary: 349 mg/dL — ABNORMAL HIGH (ref 65–99)
Glucose-Capillary: 245 mg/dL — ABNORMAL HIGH (ref 65–99)
Glucose-Capillary: 280 mg/dL — ABNORMAL HIGH (ref 65–99)
Glucose-Capillary: 350 mg/dL — ABNORMAL HIGH (ref 65–99)
Glucose-Capillary: 339 mg/dL — ABNORMAL HIGH (ref 65–99)

## 2017-05-10 LAB — VITAMIN B12: Vitamin B-12: 2650 pg/mL — ABNORMAL HIGH (ref 180–914)

## 2017-05-10 LAB — BASIC METABOLIC PANEL
Anion gap: 10 (ref 5–15)
BUN: 29 mg/dL — ABNORMAL HIGH (ref 6–20)
CALCIUM: 7.8 mg/dL — AB (ref 8.9–10.3)
CHLORIDE: 113 mmol/L — AB (ref 101–111)
CO2: 25 mmol/L (ref 22–32)
CREATININE: 1.07 mg/dL (ref 0.61–1.24)
Glucose, Bld: 292 mg/dL — ABNORMAL HIGH (ref 65–99)
Potassium: 3.3 mmol/L — ABNORMAL LOW (ref 3.5–5.1)
Sodium: 148 mmol/L — ABNORMAL HIGH (ref 135–145)

## 2017-05-10 LAB — PROTIME-INR
INR: 1.31
Prothrombin Time: 16.2 seconds — ABNORMAL HIGH (ref 11.4–15.2)

## 2017-05-10 LAB — CULTURE, BLOOD (ROUTINE X 2)
CULTURE: NO GROWTH
Special Requests: ADEQUATE

## 2017-05-10 LAB — FOLATE: FOLATE: 22 ng/mL (ref >5.9)

## 2017-05-10 LAB — PHOSPHORUS: PHOSPHORUS: 1.2 mg/dL — AB (ref 2.5–4.6)

## 2017-05-10 LAB — MAGNESIUM: MAGNESIUM: 1.9 mg/dL (ref 1.7–2.4)

## 2017-05-10 MED ORDER — INSULIN ASPART 100 UNIT/ML ~~LOC~~ SOLN
0.0000 [IU] | SUBCUTANEOUS | Status: DC
  Administered 2017-05-10: 11 [IU] via SUBCUTANEOUS
  Administered 2017-05-11 (×2): 8 [IU] via SUBCUTANEOUS
  Administered 2017-05-11: 3 [IU] via SUBCUTANEOUS
  Administered 2017-05-11 (×2): 8 [IU] via SUBCUTANEOUS
  Administered 2017-05-11: 3 [IU] via SUBCUTANEOUS
  Administered 2017-05-12: 5 [IU] via SUBCUTANEOUS
  Administered 2017-05-12: 3 [IU] via SUBCUTANEOUS
  Administered 2017-05-12 (×2): 5 [IU] via SUBCUTANEOUS
  Administered 2017-05-13: 3 [IU] via SUBCUTANEOUS
  Administered 2017-05-13: 5 [IU] via SUBCUTANEOUS
  Administered 2017-05-13 (×2): 3 [IU] via SUBCUTANEOUS
  Administered 2017-05-13: 2 [IU] via SUBCUTANEOUS
  Administered 2017-05-14: 5 [IU] via SUBCUTANEOUS
  Administered 2017-05-14: 3 [IU] via SUBCUTANEOUS
  Administered 2017-05-14 (×2): 2 [IU] via SUBCUTANEOUS
  Administered 2017-05-14: 8 [IU] via SUBCUTANEOUS
  Administered 2017-05-14: 5 [IU] via SUBCUTANEOUS
  Administered 2017-05-15: 3 [IU] via SUBCUTANEOUS
  Administered 2017-05-15: 8 [IU] via SUBCUTANEOUS
  Administered 2017-05-15: 5 [IU] via SUBCUTANEOUS
  Administered 2017-05-15: 3 [IU] via SUBCUTANEOUS

## 2017-05-10 MED ORDER — INSULIN GLARGINE 100 UNIT/ML ~~LOC~~ SOLN
10.0000 [IU] | Freq: Every day | SUBCUTANEOUS | Status: DC
  Administered 2017-05-10 – 2017-05-13 (×4): 10 [IU] via SUBCUTANEOUS
  Filled 2017-05-10 (×5): qty 0.1

## 2017-05-10 NOTE — Progress Notes (Signed)
PT Cancellation and D/C Note  Patient Details Name: William Nolan MRN: [id number] DOB: 02/05/1951   Cancelled Treatment:     Pt mobility continues to be limited by long standing joint contracture. Pt has not had a change in mobility status since his initial PT evaluation on 05/08/17. PT continues to recommend repositioning and ROM activities be completed by staff daily and defer pt back to SNF for any additional treatment. PT signing off. Call with any questions.  Amberle Lyter B. Migdalia Dk PT, DPT Acute Rehabilitation  [phone] Pager [phone]  [place] 05/10/2017, 9:36 AM

## 2017-05-10 NOTE — Care Management Important Message (Signed)
Important Message  Patient Details  Name: William Nolan MRN: [id number] Date of Birth: Oct 21, 1950   Medicare Important Message Given:  Yes    Dennison Mcdaid Montine Circle 05/10/2017, 12:18 PM

## 2017-05-10 NOTE — Progress Notes (Signed)
Discussed in LOS, per med Director, appropriate for [facility] to look at. NCM gave handoff to NCM on unit.

## 2017-05-10 NOTE — Progress Notes (Addendum)
CM received called from William Nolan regarding LTAC referral.[facility] stated 2/2 pt non communicative and no family accessible for consent to LTAC unable to consider pt for placement. Whitman Hero RN,BSN,CM

## 2017-05-10 NOTE — Progress Notes (Signed)
Inpatient Diabetes Program Recommendations  AACE/ADA: New Consensus Statement on Inpatient Glycemic Control (2015)  Target Ranges:  Prepandial:   less than 140 mg/dL      Peak postprandial:   less than 180 mg/dL (1-2 hours)      Critically ill patients:  140 - 180 mg/dL   Lab Results  Component Value Date   GLUCAP 245 (H) 05/10/2017   HGBA1C 8.6 (H) 05/07/2017    Review of Glycemic Control Results for William Nolan, William Nolan (MRN [id number]) as of 05/10/2017 10:36  Ref. Range 05/09/2017 15:30 05/09/2017 20:45 05/09/2017 23:45 05/10/2017 04:03 05/10/2017 07:59  Glucose-Capillary Latest Ref Range: 65 - 99 mg/dL 248 (H) 210 (H) 281 (H) 349 (H) 245 (H)   Diabetes history: Type 2 DM Outpatient Diabetes medications: Novolog 2-8 units tid with meals, Levemir 12 units q HS Current orders for Inpatient glycemic control:  Novolog sensitive q 4 hours, Novolog 3 units q 4 hours  Inpatient Diabetes Program Recommendations: Please consider adding Levemir 10 units daily.    Thank you, Nani Gasser. Breckon Reeves, RN, MSN, CDE  Diabetes Coordinator Inpatient Glycemic Control Team Team Pager [phone] (8am-5pm) 05/10/2017 10:37 AM

## 2017-05-10 NOTE — Progress Notes (Signed)
Late Entry:   CSW received call from [place] at [place] with family contact for pt. Pt has two daughter William Nolan [phone] or [phone])- [phone]. Pt's other daughter is William Nolan [phone]. CSW reached out to [facility] as CSW was notified that staff at [facility] speaks with her about pt;s care. CSW left VM asking that she call CSW back ASAP. CSW will handoff to 5W unit CSW.     Virgie Dad [name], MSW, [place] [facility] Clinical Social Worker [phone]
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
## 2017-05-11 DIAGNOSIS — E876 Hypokalemia: Secondary | ICD-10-CM

## 2017-05-11 DIAGNOSIS — J986 Disorders of diaphragm: Secondary | ICD-10-CM

## 2017-05-11 LAB — GLUCOSE, CAPILLARY
GLUCOSE-CAPILLARY: 274 mg/dL — AB (ref 65–99)
GLUCOSE-CAPILLARY: 298 mg/dL — AB (ref 65–99)
Glucose-Capillary: 180 mg/dL — ABNORMAL HIGH (ref 65–99)
Glucose-Capillary: 186 mg/dL — ABNORMAL HIGH (ref 65–99)
Glucose-Capillary: 233 mg/dL — ABNORMAL HIGH (ref 65–99)
Glucose-Capillary: 258 mg/dL — ABNORMAL HIGH (ref 65–99)
Glucose-Capillary: 279 mg/dL — ABNORMAL HIGH (ref 65–99)

## 2017-05-11 LAB — BASIC METABOLIC PANEL
ANION GAP: 8 (ref 5–15)
BUN: 19 mg/dL (ref 6–20)
CO2: 27 mmol/L (ref 22–32)
Calcium: 7.5 mg/dL — ABNORMAL LOW (ref 8.9–10.3)
Chloride: 109 mmol/L (ref 101–111)
Creatinine, Ser: 1.03 mg/dL (ref 0.61–1.24)
GFR calc non Af Amer: >60 mL/min (ref >60)
GLUCOSE: 255 mg/dL — AB (ref 65–99)
Potassium: 3 mmol/L — ABNORMAL LOW (ref 3.5–5.1)
Sodium: 144 mmol/L (ref 135–145)

## 2017-05-11 LAB — MAGNESIUM: MAGNESIUM: 1.7 mg/dL (ref 1.7–2.4)

## 2017-05-11 LAB — CBC
HCT: 32.1 % — ABNORMAL LOW (ref 39.0–52.0)
Hemoglobin: 9.8 g/dL — ABNORMAL LOW (ref 13.0–17.0)
MCH: 27.5 pg (ref 26.0–34.0)
MCHC: 30.5 g/dL (ref 30.0–36.0)
MCV: 90.2 fL (ref 78.0–100.0)
PLATELETS: 94 10*3/uL — AB (ref 150–400)
RBC: 3.56 MIL/uL — AB (ref 4.22–5.81)
RDW: 15.9 % — AB (ref 11.5–15.5)
WBC: 15.1 10*3/uL — AB (ref 4.0–10.5)

## 2017-05-11 MED ORDER — INSULIN ASPART 100 UNIT/ML ~~LOC~~ SOLN
8.0000 [IU] | Freq: Three times a day (TID) | SUBCUTANEOUS | Status: DC
  Administered 2017-05-12 – 2017-05-14 (×8): 8 [IU] via SUBCUTANEOUS

## 2017-05-11 MED ORDER — POTASSIUM CHLORIDE 20 MEQ/15ML (10%) PO SOLN
50.0000 meq | Freq: Once | ORAL | Status: AC
  Administered 2017-05-11: 50 meq
  Filled 2017-05-11: qty 45

## 2017-05-11 MED ORDER — MAGNESIUM SULFATE 2 GM/50ML IV SOLN
2.0000 g | Freq: Once | INTRAVENOUS | Status: AC
  Administered 2017-05-11: 2 g via INTRAVENOUS
  Filled 2017-05-11: qty 50

## 2017-05-11 NOTE — Progress Notes (Signed)
Pt had 37 beats SVT, at the same time RRT was attempting PT. VS stable, HR in 110-120 range, pt asymptomatic.  Spoke with Donnal Debar, NP. We will continue to monitor pt at this time.  MRI ready to take pt, due to above occurrence not recommended to take pt off telemetry. MRI called and test rescheduled for later tonight.

## 2017-05-11 NOTE — Progress Notes (Signed)
Results for HORST, OSTERMILLER (MRN [id number]) as of 05/11/2017 11:42  Ref. Range 05/10/2017 16:28 05/10/2017 20:12 05/11/2017 00:45 05/11/2017 03:59 05/11/2017 08:07  Glucose-Capillary Latest Ref Range: 65 - 99 mg/dL 350 (H) 339 (H) 180 (H) 186 (H) 279 (H)  Noted that blood sugars have been greater than 180 mg/dl.  Recommend increasing Lantus to 15 units every HS, continuing Novolog MODERATE correction scale every 4 hours, and, if blood sugars continue to be elevated, may need to add Novolog 3 units every 4 hours for continuous tube feed coverage.   Harvel Ricks RN BSN CDE Diabetes Coordinator Pager: [phone]  8am-5pm
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
## 2017-05-11 NOTE — Progress Notes (Signed)
No acute changes noted in patient, wounds dressings clean dry and intact, patient slept for most part of the night. Continues on NGT feeding Jevity at 2ml/hr, wter flush 216ml Q4hr.  CBG monitored and patient received insulin coverage through the shift.

## 2017-05-11 NOTE — Progress Notes (Signed)
CM received consult: Who is patient's HCPOA? None listed and patient's demographics. Significant healthcare decisions need to be addressed.  Pt has two daughters Misha Antonini [phone] or [phone])- [phone]. Pt's other daughter is Zacarias Pontes [phone]. CM has called and left voice messages with both daughters requesting a return call be made to discuss dad's transition of care. Whitman Hero RN,BSN,CM

## 2017-05-11 NOTE — Progress Notes (Signed)
CSW received call Helene Kelp at [facility] for an update on pt's care. CSW informed her that pt has moved off of 1M unit and is now on 5W. CSW also informed Helene Kelp that CSW has not been able to speak with family after calling and leaving VM's asking that pt's daughter call CSW back. At this time there is no new information provided to this CSW as this CSW is no longer on pt's case. 1M CSW will update 5W CSW ASAP.     Virgie Dad Comfort Iversen, MSW, [facility] Clinical Social Worker [phone]
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
## 2017-05-12 ENCOUNTER — Inpatient Hospital Stay (HOSPITAL_COMMUNITY): Payer: Medicare Other

## 2017-05-12 DIAGNOSIS — C7970 Secondary malignant neoplasm of unspecified adrenal gland: Secondary | ICD-10-CM

## 2017-05-12 DIAGNOSIS — R400 Somnolence: Secondary | ICD-10-CM

## 2017-05-12 DIAGNOSIS — C801 Malignant (primary) neoplasm, unspecified: Secondary | ICD-10-CM

## 2017-05-12 LAB — CBC
HEMATOCRIT: 29.8 % — AB (ref 39.0–52.0)
Hemoglobin: 9.2 g/dL — ABNORMAL LOW (ref 13.0–17.0)
MCH: 27.6 pg (ref 26.0–34.0)
MCHC: 30.9 g/dL (ref 30.0–36.0)
MCV: 89.5 fL (ref 78.0–100.0)
PLATELETS: 134 10*3/uL — AB (ref 150–400)
RBC: 3.33 MIL/uL — AB (ref 4.22–5.81)
RDW: 16 % — ABNORMAL HIGH (ref 11.5–15.5)
WBC: 13.8 10*3/uL — ABNORMAL HIGH (ref 4.0–10.5)

## 2017-05-12 LAB — BASIC METABOLIC PANEL
Anion gap: 9 (ref 5–15)
BUN: 18 mg/dL (ref 6–20)
CHLORIDE: 106 mmol/L (ref 101–111)
CO2: 29 mmol/L (ref 22–32)
Calcium: 7.5 mg/dL — ABNORMAL LOW (ref 8.9–10.3)
Creatinine, Ser: 1.12 mg/dL (ref 0.61–1.24)
GFR calc Af Amer: >60 mL/min (ref >60)
GLUCOSE: 231 mg/dL — AB (ref 65–99)
POTASSIUM: 3.5 mmol/L (ref 3.5–5.1)
Sodium: 144 mmol/L (ref 135–145)

## 2017-05-12 LAB — GLUCOSE, CAPILLARY
GLUCOSE-CAPILLARY: 246 mg/dL — AB (ref 65–99)
GLUCOSE-CAPILLARY: 213 mg/dL — AB (ref 65–99)
GLUCOSE-CAPILLARY: 164 mg/dL — AB (ref 65–99)
GLUCOSE-CAPILLARY: 107 mg/dL — AB (ref 65–99)
GLUCOSE-CAPILLARY: 98 mg/dL (ref 65–99)
Glucose-Capillary: 111 mg/dL — ABNORMAL HIGH (ref 65–99)

## 2017-05-12 LAB — MAGNESIUM: Magnesium: 2.2 mg/dL (ref 1.7–2.4)

## 2017-05-12 LAB — OCCULT BLOOD X 1 CARD TO LAB, STOOL: Fecal Occult Bld: POSITIVE — AB

## 2017-05-12 MED ORDER — IOPAMIDOL (ISOVUE-300) INJECTION 61%
INTRAVENOUS | Status: AC
  Administered 2017-05-12: 20:00:00
  Filled 2017-05-12: qty 30

## 2017-05-12 MED ORDER — IOPAMIDOL (ISOVUE-300) INJECTION 61%
INTRAVENOUS | Status: AC
  Administered 2017-05-13: 100 mL
  Filled 2017-05-12: qty 100

## 2017-05-12 MED ORDER — GADOBENATE DIMEGLUMINE 529 MG/ML IV SOLN
13.0000 mL | Freq: Once | INTRAVENOUS | Status: AC | PRN
  Administered 2017-05-12: 13 mL via INTRAVENOUS

## 2017-05-12 NOTE — Progress Notes (Signed)
MD said it was okay to give insulin coverage for the patient since he is receiving tube feedings.

## 2017-05-12 NOTE — Progress Notes (Signed)
Pt transported to MRI 

## 2017-05-12 NOTE — Progress Notes (Signed)
Wound care was done. 
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
## 2017-05-12 NOTE — Consult Note (Signed)
Patient seen lying in bed.However, he was unable to communicate with me and as such unable to evaluate him for capacity to make medical decision.  Dr. Clydene Laming informed.  Corena Pilgrim, MD

## 2017-05-12 NOTE — Progress Notes (Signed)
MD notified of loose stools and of CBG of 107 on whether to hold the 8 unit coverage or not.

## 2017-05-12 NOTE — Progress Notes (Signed)
Psych doctor met with the patient and the patient won't talk to him. MD was paged and psych doctor is talking with MD.
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
## 2017-05-12 NOTE — Progress Notes (Signed)
CBG 111 at this time.  Machine not transferring data appropriately.

## 2017-05-13 ENCOUNTER — Inpatient Hospital Stay (HOSPITAL_COMMUNITY): Payer: Medicare Other

## 2017-05-13 DIAGNOSIS — R1310 Dysphagia, unspecified: Secondary | ICD-10-CM

## 2017-05-13 DIAGNOSIS — D631 Anemia in chronic kidney disease: Secondary | ICD-10-CM

## 2017-05-13 DIAGNOSIS — C7971 Secondary malignant neoplasm of right adrenal gland: Secondary | ICD-10-CM

## 2017-05-13 DIAGNOSIS — C7972 Secondary malignant neoplasm of left adrenal gland: Secondary | ICD-10-CM

## 2017-05-13 DIAGNOSIS — N183 Chronic kidney disease, stage 3 (moderate): Secondary | ICD-10-CM

## 2017-05-13 LAB — GLUCOSE, CAPILLARY
GLUCOSE-CAPILLARY: 148 mg/dL — AB (ref 65–99)
GLUCOSE-CAPILLARY: 207 mg/dL — AB (ref 65–99)
GLUCOSE-CAPILLARY: 246 mg/dL — AB (ref 65–99)
Glucose-Capillary: 170 mg/dL — ABNORMAL HIGH (ref 65–99)
Glucose-Capillary: 231 mg/dL — ABNORMAL HIGH (ref 65–99)
Glucose-Capillary: 185 mg/dL — ABNORMAL HIGH (ref 65–99)
Glucose-Capillary: 118 mg/dL — ABNORMAL HIGH (ref 65–99)
Glucose-Capillary: 194 mg/dL — ABNORMAL HIGH (ref 65–99)

## 2017-05-13 LAB — CBC
HCT: 31.3 % — ABNORMAL LOW (ref 39.0–52.0)
HEMOGLOBIN: 9.7 g/dL — AB (ref 13.0–17.0)
MCH: 27.8 pg (ref 26.0–34.0)
MCHC: 31 g/dL (ref 30.0–36.0)
MCV: 89.7 fL (ref 78.0–100.0)
Platelets: 162 10*3/uL (ref 150–400)
RBC: 3.49 MIL/uL — ABNORMAL LOW (ref 4.22–5.81)
RDW: 16.8 % — ABNORMAL HIGH (ref 11.5–15.5)
WBC: 14.6 10*3/uL — ABNORMAL HIGH (ref 4.0–10.5)

## 2017-05-13 LAB — BASIC METABOLIC PANEL
Anion gap: 10 (ref 5–15)
BUN: 17 mg/dL (ref 6–20)
CHLORIDE: 104 mmol/L (ref 101–111)
CO2: 24 mmol/L (ref 22–32)
CREATININE: 0.94 mg/dL (ref 0.61–1.24)
Calcium: 7.4 mg/dL — ABNORMAL LOW (ref 8.9–10.3)
GFR calc Af Amer: >60 mL/min (ref >60)
GFR calc non Af Amer: >60 mL/min (ref >60)
GLUCOSE: 145 mg/dL — AB (ref 65–99)
Potassium: 3.7 mmol/L (ref 3.5–5.1)
SODIUM: 138 mmol/L (ref 135–145)

## 2017-05-13 LAB — MAGNESIUM: Magnesium: 2.2 mg/dL (ref 1.7–2.4)

## 2017-05-13 MED ORDER — MAGNESIUM SULFATE 2 GM/50ML IV SOLN
2.0000 g | Freq: Once | INTRAVENOUS | Status: AC
  Administered 2017-05-13: 2 g via INTRAVENOUS
  Filled 2017-05-13 (×2): qty 50

## 2017-05-13 MED ORDER — POTASSIUM CHLORIDE 20 MEQ/15ML (10%) PO SOLN
40.0000 meq | Freq: Two times a day (BID) | ORAL | Status: DC
  Administered 2017-05-13 – 2017-05-14 (×2): 40 meq
  Filled 2017-05-13 (×2): qty 30

## 2017-05-13 NOTE — Progress Notes (Signed)
MD verbal order to not give insulin for CBG of 118.

## 2017-05-13 NOTE — Progress Notes (Signed)
Wound care was done, all foam dressings were changed. They look better than yesterday and are healing well.
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
## 2017-05-14 DIAGNOSIS — Z7189 Other specified counseling: Secondary | ICD-10-CM

## 2017-05-14 DIAGNOSIS — Z515 Encounter for palliative care: Secondary | ICD-10-CM

## 2017-05-14 LAB — BASIC METABOLIC PANEL
ANION GAP: 8 (ref 5–15)
BUN: 19 mg/dL (ref 6–20)
CHLORIDE: 106 mmol/L (ref 101–111)
CO2: 23 mmol/L (ref 22–32)
Calcium: 7.6 mg/dL — ABNORMAL LOW (ref 8.9–10.3)
Creatinine, Ser: 0.9 mg/dL (ref 0.61–1.24)
GFR calc Af Amer: >60 mL/min (ref >60)
GFR calc non Af Amer: >60 mL/min (ref >60)
GLUCOSE: 205 mg/dL — AB (ref 65–99)
Potassium: 4.5 mmol/L (ref 3.5–5.1)
Sodium: 137 mmol/L (ref 135–145)

## 2017-05-14 LAB — CBC
HEMATOCRIT: 30 % — AB (ref 39.0–52.0)
HEMOGLOBIN: 9.5 g/dL — AB (ref 13.0–17.0)
MCH: 27.9 pg (ref 26.0–34.0)
MCHC: 31.7 g/dL (ref 30.0–36.0)
MCV: 88.2 fL (ref 78.0–100.0)
Platelets: 229 10*3/uL (ref 150–400)
RBC: 3.4 MIL/uL — ABNORMAL LOW (ref 4.22–5.81)
RDW: 16.6 % — ABNORMAL HIGH (ref 11.5–15.5)
WBC: 12.2 10*3/uL — AB (ref 4.0–10.5)

## 2017-05-14 LAB — GLUCOSE, CAPILLARY
GLUCOSE-CAPILLARY: 126 mg/dL — AB (ref 65–99)
GLUCOSE-CAPILLARY: 209 mg/dL — AB (ref 65–99)
GLUCOSE-CAPILLARY: 223 mg/dL — AB (ref 65–99)
Glucose-Capillary: 254 mg/dL — ABNORMAL HIGH (ref 65–99)
Glucose-Capillary: 189 mg/dL — ABNORMAL HIGH (ref 65–99)
Glucose-Capillary: 127 mg/dL — ABNORMAL HIGH (ref 65–99)

## 2017-05-14 LAB — MAGNESIUM: MAGNESIUM: 2.8 mg/dL — AB (ref 1.7–2.4)

## 2017-05-14 MED ORDER — INSULIN GLARGINE 100 UNIT/ML ~~LOC~~ SOLN
14.0000 [IU] | Freq: Every day | SUBCUTANEOUS | Status: DC
  Administered 2017-05-14: 14 [IU] via SUBCUTANEOUS
  Filled 2017-05-14 (×3): qty 0.14

## 2017-05-14 MED ORDER — FREE WATER
200.0000 mL | Freq: Three times a day (TID) | Status: DC
  Administered 2017-05-14 – 2017-05-15 (×3): 200 mL

## 2017-05-14 MED ORDER — ASPIRIN 325 MG PO TABS
325.0000 mg | ORAL_TABLET | Freq: Every day | ORAL | Status: DC
  Administered 2017-05-14 – 2017-05-15 (×2): 325 mg
  Filled 2017-05-14 (×2): qty 1

## 2017-05-14 MED ORDER — PANTOPRAZOLE SODIUM 40 MG PO PACK
40.0000 mg | PACK | Freq: Every day | ORAL | Status: DC
  Administered 2017-05-14 – 2017-05-15 (×2): 40 mg
  Filled 2017-05-14 (×2): qty 20

## 2017-05-14 MED ORDER — INSULIN ASPART 100 UNIT/ML ~~LOC~~ SOLN
4.0000 [IU] | Freq: Four times a day (QID) | SUBCUTANEOUS | Status: DC
  Administered 2017-05-14 – 2017-05-15 (×3): 4 [IU] via SUBCUTANEOUS

## 2017-05-14 NOTE — Progress Notes (Signed)
  Speech Language Pathology Treatment: Dysphagia  Patient Details Name: William Nolan MRN: [id number] DOB: 12-30-1950 Today's Date: 05/14/2017 Time: 9892-1194 SLP Time Calculation (min) (ACUTE ONLY): 21 min  Assessment / Plan / Recommendation Clinical Impression  Skilled observation of PO intake with differential dx and moderate verbal cues provided during trials of nectar-thickened liquids via tsp, ice chips via tsp and puree with delayed cough noted after ice chips/puree, weak (but improving) cough upon request during assessment and improved ability to follow simple directives (I.e.: open mouth, stick out your tongue); pt's mentation appears to have improved since last session where pt had difficulty following 1-step directives and was attempting to put Cortrak in mouth during PO trials.  Multiple swallows noted independently and delay in the initiation of the swallow noted as well during observation; recommend MBS to determine if PO intake orally is a possibility and/or continue non-oral feeding and pursue PEG placement.  HPI HPI: Pt is a 66 y.o.malewith PMH of prior CVA with Left sided deficits (also with global aphasia, dysphagia), HTN, HLD,and CADadmitted for Acute hypoxic event and fever after reported choking event at SNF, also found to have a small subacute L frontal lobe infarct. His SNF believes that the pt's swallowing has worsened over the past few weeks. He was intubated 12/8 and was extubated 12/10 when the ETT was dislodged during bathing.       SLP Plan  MBS       Recommendations  Diet recommendations: NPO Medication Administration: Via alternative means                Oral Care Recommendations: Oral care QID Follow up Recommendations: Skilled Nursing facility SLP Visit Diagnosis: Dysphagia, oropharyngeal phase (R13.12) Plan: MBS                      Elvina Sidle, M.S., CCC-SLP 05/14/2017, 3:23 PM

## 2017-05-14 NOTE — Care Management Important Message (Signed)
Important Message  Patient Details  Name: William Nolan MRN: [id number] Date of Birth: 1951/03/12   Medicare Important Message Given:  Yes    Nathen May 05/14/2017, 10:53 AM

## 2017-05-14 NOTE — Progress Notes (Signed)
Inpatient Diabetes Program Recommendations  AACE/ADA: New Consensus Statement on Inpatient Glycemic Control (2015)  Target Ranges:  Prepandial:   less than 140 mg/dL      Peak postprandial:   less than 180 mg/dL (1-2 hours)      Critically ill patients:  140 - 180 mg/dL   Results for William Nolan, William Nolan (MRN [id number]) as of 05/14/2017 10:57  Ref. Range 05/13/2017 07:50 05/13/2017 08:21 05/13/2017 11:47 05/13/2017 15:55 05/13/2017 20:21 05/13/2017 22:23 05/14/2017 00:35 05/14/2017 04:23  Glucose-Capillary Latest Ref Range: 65 - 99 mg/dL 207 (H) 231 (H) 185 (H) 118 (H) 194 (H) 246 (H) 209 (H) 223 (H)   Review of Glycemic Control  Current orders for Inpatient glycemic control: Lantus 10 units QHS, Novolog 8 units ACHS, Novolog 0-15 units Q4H; Jevity @ 60 ml/hr  Inpatient Diabetes Program Recommendations:  Insulin - Basal: Please consider increasing Lantus to 14 units QHS. Insulin - Tube Feeding Coverage: Please consider discontinuing Novolog 8 units ACHS. If tube feeding is continued, please consider ordering Novolog 4 units Q4H for tube feeding coverage.  Thanks, Barnie Alderman, RN, MSN, CDE Diabetes Coordinator Inpatient Diabetes Program [phone] (Team Pager from 8am to 5pm)
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
## 2017-05-14 NOTE — Progress Notes (Signed)
Nutrition Follow-up  DOCUMENTATION CODES:   Severe malnutrition in context of chronic illness, Underweight  INTERVENTION:   Continue Jevity 1.2 at 68m/hr (1443mper day)  Pro-stat 3025mnce daily  Provides 1828 calories, 95gm protein, 1166m5mee water  With 200mL5m per MD, provides 1766mL 25m water  Recommend check Phosphorus (Most recent 1.2 on 12/13)  NUTRITION DIAGNOSIS:   Severe Malnutrition related to chronic illness(stroke) as evidenced by severe fat depletion, severe muscle depletion. -ongoing  GOAL:   Patient will meet greater than or equal to 90% of their needs -met with TF  MONITOR:   TF tolerance, Labs, Skin, I & O's  ASSESSMENT:   66 y.o47Male PMH of prior CVA with Left sided deficits, HTN, HLD, and CAD; admitted for acute hypoxic event and fever.  Remains on Jevity with AMS. Failed swallow eval 12/14 Per Palliative PA note,  They are unable to reach his family. Deemed unable to make appropriate medical decisions at this time  Repeat swallow eval ordered for patient today.  If aggressive care is pursued and patient unable to swallow safely, patient will likely require a  PEG tube. Monitor GOC. Found to have diffuse hepatic metastatic disease 12/15 via MRI, confirmed via CT abd and pelvis  Resting at bedside comfortably. No complaints.   Intake/Output Summary (Last 24 hours) at 05/14/2017 1235 Last data filed at 05/13/2017 1900 Gross per 24 hour  Intake 1150 ml  Output 801 ml  Net 349 ml   Labs reviewed:  CBGs 189, 254, 223  Medications reviewed and include:  Insulin, 200mL f47mwater Q8H per MD 40K+ per tube  Diet Order:  Diet NPO time specified  EDUCATION NEEDS:   No education needs have been identified at this time  Skin:  Skin Assessment: Skin Integrity Issues: Skin Integrity Issues:: Stage II Stage II: bilateral ischial tuberosities  Last BM:  05/13/2017  Height:   Ht Readings from Last 1 Encounters:  05/06/17 5' 9"  (1.753  m)    Weight:   Wt Readings from Last 1 Encounters:  05/14/17 132 lb 15 oz (60.3 kg)    Ideal Body Weight:  72.7 kg  BMI:  Body mass index is 19.63 kg/m.  Estimated Nutritional Needs:   Kcal:  1700-1900  Protein:  90-105 gm  Fluid:  1.7-1.9 L  WilliamSatira Anis MS, RD LDN Inpatient Clinical Dietitian Pager [phone]

## 2017-05-14 NOTE — Progress Notes (Signed)
CSW contacted the contact numbers provided for patient's daughters Vida Roller or Mickel Baas [phone]). No answer and CSW left voicemails. Per [facility], their staff has been unable to reach them either. Patient is able to return to [facility] per admissions.   Percell Locus Knowledge Escandon LCSWA [phone]

## 2017-05-15 ENCOUNTER — Inpatient Hospital Stay (HOSPITAL_COMMUNITY): Payer: Medicare Other

## 2017-05-15 LAB — BASIC METABOLIC PANEL
ANION GAP: 10 (ref 5–15)
BUN: 22 mg/dL — ABNORMAL HIGH (ref 6–20)
CHLORIDE: 102 mmol/L (ref 101–111)
CO2: 23 mmol/L (ref 22–32)
Calcium: 7.6 mg/dL — ABNORMAL LOW (ref 8.9–10.3)
Creatinine, Ser: 0.92 mg/dL (ref 0.61–1.24)
GFR calc Af Amer: >60 mL/min (ref >60)
Glucose, Bld: 191 mg/dL — ABNORMAL HIGH (ref 65–99)
POTASSIUM: 4.4 mmol/L (ref 3.5–5.1)
SODIUM: 135 mmol/L (ref 135–145)

## 2017-05-15 LAB — CBC
HEMATOCRIT: 29.2 % — AB (ref 39.0–52.0)
HEMOGLOBIN: 9.3 g/dL — AB (ref 13.0–17.0)
MCH: 27.8 pg (ref 26.0–34.0)
MCHC: 31.8 g/dL (ref 30.0–36.0)
MCV: 87.2 fL (ref 78.0–100.0)
Platelets: 249 10*3/uL (ref 150–400)
RBC: 3.35 MIL/uL — AB (ref 4.22–5.81)
RDW: 16.9 % — ABNORMAL HIGH (ref 11.5–15.5)
WBC: 13.4 10*3/uL — AB (ref 4.0–10.5)

## 2017-05-15 LAB — GLUCOSE, CAPILLARY
GLUCOSE-CAPILLARY: 179 mg/dL — AB (ref 65–99)
GLUCOSE-CAPILLARY: 122 mg/dL — AB (ref 65–99)
GLUCOSE-CAPILLARY: 263 mg/dL — AB (ref 65–99)
GLUCOSE-CAPILLARY: 178 mg/dL — AB (ref 65–99)
GLUCOSE-CAPILLARY: 151 mg/dL — AB (ref 65–99)
GLUCOSE-CAPILLARY: 121 mg/dL — AB (ref 65–99)
Glucose-Capillary: 114 mg/dL — ABNORMAL HIGH (ref 65–99)
Glucose-Capillary: 208 mg/dL — ABNORMAL HIGH (ref 65–99)

## 2017-05-15 MED ORDER — DEXTROSE-NACL 5-0.9 % IV SOLN
INTRAVENOUS | Status: DC
  Administered 2017-05-16 – 2017-05-17 (×3): via INTRAVENOUS

## 2017-05-15 MED ORDER — INSULIN ASPART 100 UNIT/ML ~~LOC~~ SOLN
0.0000 [IU] | SUBCUTANEOUS | Status: DC
  Administered 2017-05-15: 1 [IU] via SUBCUTANEOUS
  Administered 2017-05-16: 2 [IU] via SUBCUTANEOUS
  Administered 2017-05-16: 1 [IU] via SUBCUTANEOUS
  Administered 2017-05-16: 2 [IU] via SUBCUTANEOUS
  Administered 2017-05-16 – 2017-05-17 (×3): 1 [IU] via SUBCUTANEOUS
  Administered 2017-05-17: 2 [IU] via SUBCUTANEOUS
  Administered 2017-05-17: 1 [IU] via SUBCUTANEOUS
  Administered 2017-05-17: 2 [IU] via SUBCUTANEOUS
  Administered 2017-05-18: 1 [IU] via SUBCUTANEOUS
  Administered 2017-05-18 (×2): 2 [IU] via SUBCUTANEOUS
  Administered 2017-05-18 (×2): 1 [IU] via SUBCUTANEOUS

## 2017-05-15 MED ORDER — ACETAMINOPHEN 650 MG RE SUPP: 650.0000 mg | Freq: Four times a day (QID) | RECTAL | Status: DC | PRN

## 2017-05-15 NOTE — Progress Notes (Signed)
Per radiology patient chewing on cortrax and cortrax currently displaced in lower esophagus. MD notified. Will continue to monitor.  "5w13. Per radiology Pt cortrax is displaced in part of esophagus. tube feedings held. Please advise. Thanks."

## 2017-05-15 NOTE — Progress Notes (Signed)
Modified Barium Swallow Progress Note  Patient Details  Name: William Nolan MRN: [id number] Date of Birth: 05/31/1950  Today's Date: 05/15/2017  Modified Barium Swallow completed.  Full report located under Chart Review in the Imaging Section.  Brief recommendations include the following:  Clinical Impression  Pt has a severe oropharyngeal dysphagia with limited ability to follow commands to attempt many strategies other than bolus modification. He has oral holding and poor bolus cohesion, requiring Mod increased to Max cues to initiate posterior transit. He does not sufficiently clear POs from his mouth, intermittently letting is spill anteriorly from his lips, and ultimately needing oral suction. Pt's pharyngeal swallow is characterized by limited hyolaryngeal movement, base of tongue retraction, and pharyngeal squeeze, all of which results in significant pharyngeal residue, although mostly in the valleculae, with all consistencies tested. It also leads to incomplete glottal closure with silent penetration and/or aspiration on almost every bolus. Pt would be at a high risk for aspiration with any consistency, and is at risk for malnutrition and dehydration also given his cognitive status. I am not sure if he would be a good candidate for a PEG given his mental status and the fact that it would not eliminate his risk for aspiration. Should the decision be made to pursue a comfort-based approach, since any texture would be at risk, I would recommend Dys 1 diet and thin liquids.    Swallow Evaluation Recommendations       SLP Diet Recommendations: NPO;Other (Comment)(Dys 1, thin liquids by cup if starting comfort feeds)       Medication Administration: Via alternative means               Oral Care Recommendations: Oral care QID   Other Recommendations: Have oral suction available    Germain Osgood 05/15/2017,12:10 PM   Germain Osgood, M.A. CCC-SLP [phone]

## 2017-05-15 NOTE — Progress Notes (Signed)
CSW unable to find patient's family in [place]. William Nolan has a last known address for [employer]. They are mailing a letter to the address.   William Nolan LCSWA [phone]
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
## 2017-05-15 NOTE — Progress Notes (Signed)
NG tube discontinued. Patient tolerated well.

## 2017-05-15 NOTE — Progress Notes (Signed)
   05/15/17 0900  Clinical Encounter Type  Visited With Patient  Visit Type Initial  Referral From Nurse  Consult/Referral To Chaplain  Spiritual Encounters  Spiritual Needs Emotional;Prayer  Stress Factors  Patient Stress Factors (Stated he wants help, but could not say how)   Followed up on a [facility] for prayer.  Patient was alone.  He has a hard time speaking and with one-two words.  He seemed to be worried.  Following prayer he asked for help.  I said, do you need the nurse, no he said.  I said you want help with your life, he said yes.  He repeated help me multiple times but could not articulate what he meant.  Will follow as needed. Chaplain Katherene Ponto

## 2017-05-16 DIAGNOSIS — E43 Unspecified severe protein-calorie malnutrition: Secondary | ICD-10-CM | POA: Diagnosis present

## 2017-05-16 DIAGNOSIS — D638 Anemia in other chronic diseases classified elsewhere: Secondary | ICD-10-CM

## 2017-05-16 LAB — GLUCOSE, CAPILLARY
GLUCOSE-CAPILLARY: 112 mg/dL — AB (ref 65–99)
GLUCOSE-CAPILLARY: 125 mg/dL — AB (ref 65–99)
Glucose-Capillary: 171 mg/dL — ABNORMAL HIGH (ref 65–99)
Glucose-Capillary: 144 mg/dL — ABNORMAL HIGH (ref 65–99)
Glucose-Capillary: 151 mg/dL — ABNORMAL HIGH (ref 65–99)
Glucose-Capillary: 135 mg/dL — ABNORMAL HIGH (ref 65–99)

## 2017-05-16 LAB — SEROTONIN RELEASE ASSAY (SRA)
SRA .2 IU/mL UFH Ser-aCnc: 4 % (ref 0–20)
SRA 100IU/mL UFH Ser-aCnc: 2 % (ref 0–20)

## 2017-05-16 NOTE — Progress Notes (Signed)
CSW received consult regarding residential hospice placement. However, patient has no one to sign him in to a facility. CSW Fish farm manager.   Percell Locus Zhoey Blackstock LCSWA [phone]

## 2017-05-16 NOTE — Progress Notes (Signed)
5w13. Update. Patient is far less responsive today than yesterday and harder to arouse, and belly breathing. However VSS. MD notified.

## 2017-05-16 NOTE — Progress Notes (Addendum)
Daily Progress Note   Patient Name: William Nolan       Date: 05/16/2017 DOB: Nov 21, 1950  Age: 66 y.o. MRN#: [id number] Attending Physician: Allie Bossier, MD Primary Care Physician: Patient, No Pcp Per Admit Date: 05/05/2017  Reason for Consultation/Follow-up: Establishing goals of care   Addendum:  After speaking with Dr. Sherral Hammers about William Nolan - he agrees with a [place] for comfort care disposition and requested I place a social work order for residential hospice.  Subjective: Patient non verbal and minimally responsive to me (significant change from 2 days ago).    Spoke with bedside RN who confirmed that he is much less responsive over the last 24 hours.   Assessment: 66 yo male with history of bilateral MCA infarcts admitted with septic shock and hypoxia after a choking event.  Now appears to be declining rapidly.  Two days ago he was bright eyed and speaking with energy.  Today the light has gone out of his eyes and he is minimally responsive to my exam.  Clearly unable to take oral nutrition. Albumin 1.7.  Unfortunately no family or HCPOA has been located.   Patient Profile/HPI:  66 y.o. male  with past medical history of CAD and CVA who was admitted on 05/05/2017 with hypoxia after a choking event at [facility].  He required intubation and was found to have septic shock presumably from UTI.  His lactic acid was 9.9.  He has been treated for aspiration pneumonia and extubated.  Brain imaging revealed an acute punctate lesion but also extensive chronic infarction of both the cerebral hemispheres in the MCA and PCA territories.   Further imaging revealed bilateral pulmonary miliary nodules and diffuse metastatic disease in the liver in addition to coronary vessel calcification and  aortic atherosclerosis.  On my exam 12/17 the patient is extubated, denies discomfort and despite being dysarthric is able to appropriately answer a few simple questions.   Length of Stay: 11  Current Medications: Scheduled Meds:  . chlorhexidine  15 mL Mouth Rinse BID  . insulin aspart  0-9 Units Subcutaneous Q4H  . mouth rinse  15 mL Mouth Rinse q12n4p    Continuous Infusions: . dextrose 5 % and 0.9% NaCl 50 mL/hr at 05/16/17 0700    PRN Meds: acetaminophen  Physical Exam  Thin frail cachectic, chronically ill appearing male.  Slumped to the right  Does not respond to my loud voice or firm rubbing of his chest.  Barely attempts to open his eyes. CV tachy resp no distress Abdomen thin soft, nt, nd Extremities no edema.  Left upper extremity contracted. Skin multiple wounds  Vital Signs: BP 140/75 (BP Location: Right Arm)   Pulse (!) 118   Temp (!) 97.4 F (36.3 C) (Oral)   Resp 16   Ht 5\' 9"  (1.753 m)   Wt 59.3 kg (130 lb 11.7 oz)   SpO2 93%   BMI 19.31 kg/m  SpO2: SpO2: 93 % O2 Device: O2 Device: Not Delivered O2 Flow Rate: O2 Flow Rate (L/min): 2 L/min  Intake/output summary:   Intake/Output Summary (Last 24 hours) at 05/16/2017 1150 Last data filed at 05/16/2017 9379 Gross per 24 hour  Intake 0 ml  Output -  Net 0 ml   LBM: Last BM Date: 05/14/17 Baseline Weight: Weight: 68 kg (150 lb) Most recent weight: Weight: 59.3 kg (130 lb 11.7 oz)       Palliative Assessment/Data: 10%    Flowsheet Rows     Most Recent Value  Intake Tab  Referral Department  Hospitalist  Unit at Time of Referral  Med/Surg Unit  Palliative Care Primary Diagnosis  Neurology  Date Notified  05/13/17  Palliative Care Type  Return patient Palliative Care  Reason for referral  Clarify Goals of Care  Date of Admission  05/05/17  Date first seen by Palliative Care  05/13/17  # of days Palliative referral response time  0 Day(s)  # of days IP prior to Palliative referral   8  Clinical Assessment  Psychosocial & Spiritual Assessment  Palliative Care Outcomes      Patient Active Problem List   Diagnosis Date Noted  . Palliative care encounter   . Goals of care, counseling/discussion   . Acute respiratory failure with hypoxia and hypercapnia (HCC)   . Pressure injury of skin 05/07/2017  . Ventilator dependent ([place])   . Acute cystitis without hematuria   . Aspiration pneumonia due to gastric secretions ([place])   . AKI (acute kidney injury) ([place])   . Hypernatremia   . Septic shock ([place])   . Acute respiratory failure with hypoxia ([place]) 05/05/2017    Palliative Care Plan    Recommendations/Plan:   Social work is unable to locate family or appropriate Media planner.   Support DNR.  Recommend no further labs or diagnostic tests.  Recommend a transition to full comfort care here in the hospital   Recommend transfer [place] for comfort ASAP at this point as I believe he is nearing end of life.  Code Status:  DNR  Prognosis:   < 2 weeks due to inability to take PO intake, severe malnutrition, severe debility   Discharge Planning:  To Be Determined  Care plan was discussed with social work   Thank you for allowing the Palliative Medicine Team to assist in the care of this patient.  Total time spent:  35 min in examining patient, talking with RN, CSW and attending.     Greater than 50%  of this time was spent counseling and coordinating care related to the above assessment and plan.  Florentina Jenny, PA-C Palliative Medicine  Please contact Palliative MedicineTeam phone at [phone] for questions and concerns between 7 am - 7 pm.   Please see AMION for individual provider pager numbers.

## 2017-05-16 NOTE — Progress Notes (Signed)
[facility] Liaison:  RN visit  Received request from [place], [place] for family interest in [facility].  Chart being reviewed at this time.  Unfortunately, [facility] is not able to offer a room today.  Percell Locus, CSW, aware that [facility] liaison will follow up with CSW and family tomorrow or sooner if room becomes available.  Please do not hesitate to call with questions.   Thank you for this referral.  Edyth Gunnels, RN, [facility] Liaison [phone]  All hospital liaisons are now on [place].
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
## 2017-05-16 NOTE — Progress Notes (Signed)
CSW spoke with patient's daughter. She would like for patient to discharge to [facility]. She provided email contact info if paperwork is needed. CSW sent referral to [facility] for assessment.   Percell Locus Edison Wollschlager LCSWA [phone]

## 2017-05-16 NOTE — Progress Notes (Signed)
Just spoke to daughter Deckard Stuber on the phone [phone]. She states that she is comfortable with the patient being a DNR as well as patient going to a hospice. However due to her living in [place] and finances she will not be able to make a trip up here to see him. She was clear also if we need to call her she is open to communication. Case Manager and MD notified. Will continue to monitor.

## 2017-05-16 NOTE — Progress Notes (Signed)
Occupational Therapy Treatment and Discharge Patient Details Name: William Nolan MRN: [id number] DOB: 05/13/1951 Today's Date: 05/16/2017    History of present illness 66 y.o.malePMH of prior CVAwith Left sided deficits, HTN, HLD,and CADadmitted for Acute hypoxic event and fever. Work up revealed punctate acute/early subacute infarction within the left posterolateral parietal lobe, Extensive chronic infarction of the right greater than left cerebral hemispheres in both MCA and PCA territories   OT comments  Palm protector in place and fitting well with no evidence of redness.   Gentle PROM performed Lt UE for comofort, with pt able to assist minimally, otherwise, non responsive.  At this time, further splinting of Rt UE deferred due to medical decline and transition to full comfort.  OT will sign off at this time.  Pt unable to participate in discussion re: goal status and discharge.     Follow Up Recommendations  SNF;Other (comment)(vs. [facility] )    Equipment Recommendations  None recommended by OT    Recommendations for Other Services      Precautions / Restrictions Precautions Precautions: Fall Precaution Comments: NG tube in place       Mobility Bed Mobility                  Transfers                      Balance                                           ADL either performed or assessed with clinical judgement   ADL                                               Vision       Perception     Praxis      Cognition Arousal/Alertness: Lethargic Behavior During Therapy: Flat affect                                   General Comments: Pt minimally responsive         Exercises Other Exercises Other Exercises: Palm protector in place on Lt hand.   Pt appears to be tolerating well.  Palm protector removed, no evidence of redness.  Pt initally assisted with AAROM Lt elbow, but listed back off  to sleep.   Gentle PROM of Lt UE performed for pt comfort.  At this time will defer further splinting due to pt decline and transition to comfort care.     Shoulder Instructions       General Comments      Pertinent Vitals/ Pain       Pain Assessment: Faces Faces Pain Scale: Hurts a little bit Pain Location: grimmaces when removing palm protector Lt hand  Pain Descriptors / Indicators: Grimacing Pain Intervention(s): Monitored during session;Limited activity within patient's tolerance;Repositioned  Home Living                                          Prior Functioning/Environment  Frequency           Progress Toward Goals  OT Goals(current goals can now be found in the care plan section)  Progress towards OT goals: Not progressing toward goals - comment(discharge, no further needs )     Plan Other (comment)(discharge due to no further needs )    Co-evaluation                 AM-PAC PT "6 Clicks" Daily Activity     Outcome Measure   Help from another person eating meals?: Total Help from another person taking care of personal grooming?: Total Help from another person toileting, which includes using toliet, bedpan, or urinal?: Total Help from another person bathing (including washing, rinsing, drying)?: Total Help from another person to put on and taking off regular upper body clothing?: Total Help from another person to put on and taking off regular lower body clothing?: Total 6 Click Score: 6    End of Session Equipment Utilized During Treatment: Oxygen  OT Visit Diagnosis: Muscle weakness (generalized) (M62.81);Hemiplegia and hemiparesis Hemiplegia - Right/Left: Left Hemiplegia - caused by: Cerebral infarction   Activity Tolerance Patient tolerated treatment well   Patient Left in bed   Nurse Communication          Time: 1216-1224 OT Time Calculation (min): 8 min  Charges: OT General Charges $OT Visit: 1  Visit OT Treatments $Therapeutic Activity: 8-22 mins  [facility], OTR/L [phone]    Lucille Passy M 05/16/2017, 12:28 PM
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
## 2017-05-16 NOTE — NC FL2 (Signed)
[place] MEDICAID [id number] LEVEL OF CARE SCREENING TOOL     IDENTIFICATION  Patient Name: William Nolan Birthdate: Jan 28, 1951 Sex: male Admission Date (Current Location): 05/05/2017  [facility] and [place] Number:  Herbalist and Address:  [facility], [place] [address], [place], [place] [address]      Provider Number: [id number]  Attending Physician Name and Address:  Allie Bossier, MD  Relative Name and Phone Number:       Current Level of Care: Hospital Recommended Level of Care: [place] Prior Approval Number:    Date Approved/Denied:   PASRR Number:    Discharge Plan: SNF    Current Diagnoses: Patient Active Problem List   Diagnosis Date Noted  . Palliative care encounter   . Goals of care, counseling/discussion   . Acute respiratory failure with hypoxia and hypercapnia (HCC)   . Pressure injury of skin 05/07/2017  . Ventilator dependent ([place])   . Acute cystitis without hematuria   . Aspiration pneumonia due to gastric secretions ([place])   . AKI (acute kidney injury) ([place])   . Hypernatremia   . Septic shock ([place])   . Acute respiratory failure with hypoxia (HCC) 05/05/2017    Orientation RESPIRATION BLADDER Height & Weight     Self  Normal Incontinent, External catheter Weight: 59.3 kg (130 lb 11.7 oz) Height:  5\' 9"  (175.3 cm)  BEHAVIORAL SYMPTOMS/MOOD NEUROLOGICAL BOWEL NUTRITION STATUS      Incontinent Diet(Please see DC Summary)  AMBULATORY STATUS COMMUNICATION OF NEEDS Skin   Extensive Assist Verbally PU Stage and Appropriate Care(Stage II on sacrum w/ foam dressing)                       Personal Care Assistance Level of Assistance  Bathing, Feeding, Dressing Bathing Assistance: Maximum assistance Feeding assistance: Maximum assistance Dressing Assistance: Maximum assistance     Functional Limitations Info  Sight, Hearing, Speech Sight Info: Adequate Hearing Info: Adequate Speech Info: Adequate     SPECIAL CARE FACTORS FREQUENCY  PT (By licensed PT), OT (By licensed OT), Speech therapy     PT Frequency: 5x/week OT Frequency: 3x/week     Speech Therapy Frequency: 2x/week      Contractures      Additional Factors Info  Code Status, Allergies, Insulin Sliding Scale Code Status Info: DNR Allergies Info: Heparin   Insulin Sliding Scale Info: Every 4 hours       Current Medications (05/16/2017):  This is the current hospital active medication list Current Facility-Administered Medications  Medication Dose Route Frequency Provider Last Rate Last Dose  . acetaminophen (TYLENOL) suppository 650 mg  650 mg Rectal Q6H PRN Cherene Altes, MD      . chlorhexidine (PERIDEX) 0.12 % solution 15 mL  15 mL Mouth Rinse BID Erick Colace, NP   15 mL at 05/16/17 0812  . dextrose 5 %-0.9 % sodium chloride infusion   Intravenous Continuous Cherene Altes, MD 50 mL/hr at 05/16/17 0700    . insulin aspart (novoLOG) injection 0-9 Units  0-9 Units Subcutaneous Q4H Cherene Altes, MD   1 Units at 05/16/17 [phone]  . MEDLINE mouth rinse  15 mL Mouth Rinse q12n4p Erick Colace, NP   15 mL at 05/15/17 1748     Discharge Medications: Please see discharge summary for a list of discharge medications.  Relevant Imaging Results:  Relevant Lab Results:   Additional [place]  S Vannary [place], LCSWA

## 2017-05-17 LAB — BASIC METABOLIC PANEL
Anion gap: 8 (ref 5–15)
BUN: 18 mg/dL (ref 6–20)
CALCIUM: 7.9 mg/dL — AB (ref 8.9–10.3)
CO2: 26 mmol/L (ref 22–32)
CREATININE: 0.88 mg/dL (ref 0.61–1.24)
Chloride: 104 mmol/L (ref 101–111)
GFR calc Af Amer: >60 mL/min (ref >60)
GLUCOSE: 151 mg/dL — AB (ref 65–99)
Potassium: 3.8 mmol/L (ref 3.5–5.1)
Sodium: 138 mmol/L (ref 135–145)

## 2017-05-17 LAB — GLUCOSE, CAPILLARY
GLUCOSE-CAPILLARY: 148 mg/dL — AB (ref 65–99)
Glucose-Capillary: 151 mg/dL — ABNORMAL HIGH (ref 65–99)
Glucose-Capillary: 126 mg/dL — ABNORMAL HIGH (ref 65–99)
Glucose-Capillary: 163 mg/dL — ABNORMAL HIGH (ref 65–99)
Glucose-Capillary: 104 mg/dL — ABNORMAL HIGH (ref 65–99)

## 2017-05-17 LAB — CBC
HCT: 27.5 % — ABNORMAL LOW (ref 39.0–52.0)
HEMOGLOBIN: 8.7 g/dL — AB (ref 13.0–17.0)
MCH: 28.2 pg (ref 26.0–34.0)
MCHC: 31.6 g/dL (ref 30.0–36.0)
MCV: 89 fL (ref 78.0–100.0)
PLATELETS: 309 10*3/uL (ref 150–400)
RBC: 3.09 MIL/uL — ABNORMAL LOW (ref 4.22–5.81)
RDW: 18 % — ABNORMAL HIGH (ref 11.5–15.5)
WBC: 14.3 10*3/uL — ABNORMAL HIGH (ref 4.0–10.5)

## 2017-05-17 LAB — MAGNESIUM: Magnesium: 1.8 mg/dL (ref 1.7–2.4)
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
## 2017-05-17 NOTE — Progress Notes (Addendum)
[place] [facility] Liaison:  RN  Left message for Whitman Hero, that patient was approved to come to [facility], pending getting paperwork signed with daughter, Claiborne Billings, which she can do tomorrow.  Bed will be available after paperwork has been signed and received.  Updated Percell Locus, LCSW.  Thank you for this referral.  Edyth Gunnels, RN, [place] [facility] Liaison [phone]  All hospital liaisons are now on [place].

## 2017-05-17 NOTE — Progress Notes (Signed)
SLP Cancellation Note  Patient Details Name: William Nolan MRN: [id number] DOB: January 26, 1951   Cancelled treatment:       Reason Eval/Treat Not Completed: Other (comment)(pt transitioning to hospice and is now comfort care, as a result pt is no longer appropriate for ST interventions,  ST to sign off)   Almer Littleton, Selinda Orion 05/17/2017, 3:28 PM

## 2017-05-18 DIAGNOSIS — C787 Secondary malignant neoplasm of liver and intrahepatic bile duct: Secondary | ICD-10-CM

## 2017-05-18 DIAGNOSIS — E43 Unspecified severe protein-calorie malnutrition: Secondary | ICD-10-CM | POA: Diagnosis present

## 2017-05-18 LAB — GLUCOSE, CAPILLARY
GLUCOSE-CAPILLARY: 134 mg/dL — AB (ref 65–99)
Glucose-Capillary: 137 mg/dL — ABNORMAL HIGH (ref 65–99)
Glucose-Capillary: 141 mg/dL — ABNORMAL HIGH (ref 65–99)
Glucose-Capillary: 145 mg/dL — ABNORMAL HIGH (ref 65–99)
Glucose-Capillary: 161 mg/dL — ABNORMAL HIGH (ref 65–99)
Glucose-Capillary: 187 mg/dL — ABNORMAL HIGH (ref 65–99)

## 2017-05-18 MED ORDER — ACETAMINOPHEN 325 MG PO TABS: 650.0000 mg | ORAL_TABLET | Freq: Four times a day (QID) | ORAL | Status: DC | PRN

## 2017-05-18 NOTE — Progress Notes (Signed)
I spoke with [place] the Education officer, museum and she stated the daughter did not fill out the paper work and they lost the bed at [facility]. The patient's sister keeps calling for information and I am not giving direct information. She keeps continuing to call and was told she can not get further information. I did let the sister speak with the patient on the phone with the patient's consent.
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
## 2017-05-18 NOTE — Progress Notes (Signed)
Nutrition Brief Note  Chart reviewed. Pt now transitioning to comfort care.  Plans for d/c to [facility]. No further nutrition interventions warranted at this time.  Please re-consult as needed.   Molli Barrows, RD, LDN, [place] Pager [phone] After Hours Pager [phone]

## 2017-05-18 NOTE — Discharge Instructions (Signed)
About Your Loved One's Last Days How will my loved one be cared for?  Talk with your loved one about how he or she would like to be cared for in his or her last days. Care options may include:  Home care. Many people choose to die in their own home or in the home of a family member. This may require family members to take on the role of caregivers.  Hospice care. Hospice is a service that is designed to provide medical, spiritual, and psychological support to people who are terminally ill and their families. Hospice care can take place in a variety of settings, including at home.  Hospital care. Some people prefer the comfort of having nurses and doctors nearby at all times.  Nursing home care. Nursing homes have medical staff on duty at all times.  Spiritual care. A priest, minister, or spiritual leader can be included as a member of the caregiving team to provide guidance and counseling along the way.  You may choose to combine several care options. What changes will I see in my loved one? Your loved one will go through some changes when death is near. He or she may:  Sleep more.  Urinate less and have fewer bowel movements.  Have mucus in the mouth.  Feel colder and look bluer in color.  Be restless and behave unusually. For example, he or she may pull on the sheets, talk to people who are not in the room, or talk to people who have already died.  Have breathing changes. Breathing may not be as deep, or it may become deeper and sound like snoring. There may be as many as 30 seconds between breaths. Breathing changes may come and go.  How can I help? Here are some things that you can do to keep your loved one comfortable:  Keep a light on in your loved one's room. He or she may not be able to see well when awake.  Wait until your loved one wakes up to give any needed medicine.  Do not force your loved one to eat, drink, or take medicine.  If your loved one has thick mucus in  the throat, it may help to raise his or her head and shoulders on pillows.  Put bed pads under your loved one. Change them when needed to keep them clean and dry.  If your loved one talks to people who are not there, do not try to correct him or her.  Comfort your loved one if he or she seems scared.  Call a health care provider if your loved one expresses having a need to urinate and being unable to do so.  Keep your loved one's mouth moist for comfort. To do this you may: ? Offer chips of ice if he or she is still able to chew. ? Put petroleum jelly on his or her lips to keep them from being too dry. ? Gently wipe out his or her mouth with wet mouth sponges. You can get these sponges from your health care provider.  What happens at the time of death? At the time of death:  Breathing stops.  The heart stops beating.  The eyes may be partly open.  The mouth may drop open a little.  These are all natural occurrences. Feelings of grief or relief at this time are normal. Talk with your caregiving team about any questions or needs you have. This information is not intended to replace advice given to  a little.    These are all natural occurrences. Feelings of grief or relief at this time are normal. Talk with your caregiving team about any questions or needs you have.  This information is not intended to replace advice given to you by your health care provider. Make sure you discuss any questions you have with your health care provider.  Document Released: 05/03/2009 Document Revised: 02/23/2016 Document Reviewed: 04/22/2014  Elsevier Interactive Patient Education © 2018 Elsevier Inc.

## 2017-05-18 NOTE — Progress Notes (Signed)
CSW spoke with patient's daughter, Vida Roller. She states she will be doing the [employer] today. She was very appreciative of the hospital staff allowing her to speak to her father on the phone.   Percell Locus Kealie Barrie LCSWA [phone]

## 2017-05-18 NOTE — Progress Notes (Signed)
Patient's daughter has not done the paperwork for [facility] yet. CSW left her a voicemail. [facility] no longer has a bed for the patient but may can admit tomorrow if the daughter does the paperwork today.   Percell Locus Tanuj Mullens LCSWA [phone]

## 2017-05-18 NOTE — Care Management Important Message (Signed)
Important Message  Patient Details  Name: William Nolan MRN: [id number] Date of Birth: 1950-07-16   Medicare Important Message Given:  Yes    Aleksa Collinsworth Abena 05/18/2017, 10:10 AM

## 2017-05-19 LAB — GLUCOSE, CAPILLARY: Glucose-Capillary: 190 mg/dL — ABNORMAL HIGH (ref 65–99)

## 2017-05-19 NOTE — Progress Notes (Signed)
CSW was contacted by Amy at [facility].  Pt's daughter will not return phones.  CSW attempted to contact pt's daughter Siddharth Babington [phone] left voice messages. [place] [facility] will have to give bed away today due to not being able to contact daughter.  Reed Breech LCSWA [phone]
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
## 2017-05-19 NOTE — Progress Notes (Signed)
[facility] Liaison:  RN  Patient's daughter, Claiborne Billings, tried to send paperwork yesterday evening.  I walked her through the consents.  I spoke with [facility] this morning and they have still not received the paperwork.  I tried to call Claiborne Billings this morning and the call went straight to voicemail.  I have called and left messages x 2 this morning to try and get the paperwork completed ASAP.  Called and left message for Baxter Flattery, CSW, at  [phone].    Please let me know of any updates for this patient.    Thank you,  Edyth Gunnels, RN, BSN [facility] Liaison [phone]  All hospital liaisons are now on [place].

## 2017-05-19 NOTE — Progress Notes (Signed)
[facility] Liaison:  RN  Spoke with Claiborne Billings after hours yesterday and she was trying to complete paperwork.  Per Claiborne Billings, daughter, she did complete and sent back by email.  I had advised her that I would touch base with her this morning between 0800-0900am to ensure that we received.  [place] did not receive the email back from [place].  I called x 4 and left messages and sent a text to try and get Claiborne Billings to call me back so that paperwork could be received and patient could go to [employer] [facility] today.  I updated Baxter Flattery, LCSW, and she also attempted to reach daughter so that paperwork could be completed.  Daughter was made aware that we would need to receive paperwork early so that we could offer the bed.  Unfortunately, at this time, there is no longer a bed available for the day.  Social work to continue to try and reach daughter to try and get paperwork completed.  Admission to [facility], will be contingent on bed availability and getting paperwork completed and back to [facility].   Please do not hesitate to contact me with any hospice related questions or concerns.   Thank you for this referral.  Edyth Gunnels, RN, [facility] Liaison [phone]  All hospital liaisons are now on [place].  Please check AMION to ensure that you are contacting the liaison working for that work day.

## 2017-05-20 LAB — GLUCOSE, CAPILLARY
GLUCOSE-CAPILLARY: 316 mg/dL — AB (ref 65–99)
GLUCOSE-CAPILLARY: 250 mg/dL — AB (ref 65–99)
Glucose-Capillary: 356 mg/dL — ABNORMAL HIGH (ref 65–99)

## 2017-05-20 MED ORDER — ONDANSETRON HCL 4 MG/2ML IJ SOLN: 4.0000 mg | Freq: Four times a day (QID) | INTRAMUSCULAR | Status: DC | PRN

## 2017-05-20 MED ORDER — ONDANSETRON 4 MG PO TBDP: 4.0000 mg | ORAL_TABLET | Freq: Four times a day (QID) | ORAL | Status: DC | PRN

## 2017-05-20 MED ORDER — HALOPERIDOL LACTATE 5 MG/ML IJ SOLN: 0.5000 mg | INTRAMUSCULAR | Status: DC | PRN

## 2017-05-20 MED ORDER — MORPHINE SULFATE (PF) 2 MG/ML IV SOLN: 1.0000 mg | INTRAVENOUS | Status: DC | PRN

## 2017-05-20 MED ORDER — POLYVINYL ALCOHOL 1.4 % OP SOLN
1.0000 [drp] | Freq: Four times a day (QID) | OPHTHALMIC | Status: DC | PRN
  Filled 2017-05-20: qty 15

## 2017-05-20 MED ORDER — GLYCOPYRROLATE 1 MG PO TABS: 1.0000 mg | ORAL_TABLET | ORAL | Status: DC | PRN

## 2017-05-20 MED ORDER — GLYCOPYRROLATE 0.2 MG/ML IJ SOLN: 0.2000 mg | INTRAMUSCULAR | Status: DC | PRN

## 2017-05-20 MED ORDER — ACETAMINOPHEN 650 MG RE SUPP: 650.0000 mg | Freq: Four times a day (QID) | RECTAL | Status: DC | PRN

## 2017-05-20 MED ORDER — MORPHINE SULFATE (CONCENTRATE) 10 MG/0.5ML PO SOLN: 5.0000 mg | ORAL | Status: DC | PRN

## 2017-05-20 MED ORDER — ACETAMINOPHEN 325 MG PO TABS: 650.0000 mg | ORAL_TABLET | Freq: Four times a day (QID) | ORAL | Status: DC | PRN

## 2017-05-20 MED ORDER — FLEET ENEMA 7-19 GM/118ML RE ENEM: 1.0000 | ENEMA | Freq: Every day | RECTAL | Status: DC | PRN

## 2017-05-20 MED ORDER — HALOPERIDOL 0.5 MG PO TABS
0.5000 mg | ORAL_TABLET | ORAL | Status: DC | PRN
  Filled 2017-05-20: qty 1

## 2017-05-20 MED ORDER — BIOTENE DRY MOUTH MT LIQD: 15.0000 mL | OROMUCOSAL | Status: DC | PRN

## 2017-05-20 MED ORDER — HALOPERIDOL LACTATE 2 MG/ML PO CONC
0.5000 mg | ORAL | Status: DC | PRN
  Filled 2017-05-20: qty 0.3

## 2017-05-20 NOTE — Progress Notes (Signed)
Pt's CBG is 316 without coverage. MD Sherral Hammers made aware. Will continue to assess.
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
## 2017-05-20 NOTE — Progress Notes (Signed)
[facility] Liaison:  RN  Spoke with Cydney Ok, and discussed patient and [facility] status.  Patient had been approved to come on Friday, but noone to do paperwork.  Daughter lives in [place] and advised she could not get here to do paperwork in person.  Paperwork was sent by encrypted email from [facility] to daughter on Friday and she was to complete by late morning.  She was unable to get that done and called me after hours on Friday evening and I walked her through the paperwork.  She never returned them to [facility]  Unfortunately, [facility] is unable to offer a bed today.  We will be in touch with CSW to advise if a bed becomes available, but transfer will be contingent on getting paperwork received back from the daughter.   Thank you for the referral.   Edyth Gunnels, RN, [facility] Liaison [phone]  All hospital liaisons are now on [place].

## 2017-05-20 NOTE — Progress Notes (Addendum)
CSW was contacted by Dr. Sherral Hammers concerning pt's placement at [facility]. Dr. Sherral Hammers stated daughter had given verbal consent for pt placement for [facility].  CSW contacted Amy with [employer]. Pt's daughter Dennies Coate completed paperwork with Amy. Pt's daughter was to scan paperwork to Amy at [facility]. Amy has not received paperwork after numerous attempts. [facility] does not have any beds today for pt. CSW informed Dr. Sherral Hammers.  Reed Breech LCSWA [phone]

## 2017-05-20 NOTE — Plan of Care (Signed)
Patient eating and drinking very little, muscle wasting present. RN will continue comfort feedings as tolerated. P.J. Linus Mako, RN

## 2017-05-21 DIAGNOSIS — T80212D Local infection due to central venous catheter, subsequent encounter: Secondary | ICD-10-CM

## 2017-05-21 DIAGNOSIS — Z9289 Personal history of other medical treatment: Secondary | ICD-10-CM
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
## 2017-05-21 NOTE — Progress Notes (Signed)
[facility] still does not have beds and has been unable to reach daughter. Per MD, if we haven't heard from her by Wednesday, may need to send patient back to [facility] without hospice.  Percell Locus Hazelene Doten LCSWA [phone]
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
## 2017-05-23 DIAGNOSIS — J9600 Acute respiratory failure, unspecified whether with hypoxia or hypercapnia: Secondary | ICD-10-CM

## 2017-05-23 MED ORDER — HALOPERIDOL 0.5 MG PO TABS: 0.5000 mg | ORAL_TABLET | ORAL | Status: DC | PRN

## 2017-05-23 MED ORDER — POLYVINYL ALCOHOL 1.4 % OP SOLN: 1.0000 [drp] | Freq: Four times a day (QID) | OPHTHALMIC | 0 refills | Status: DC | PRN

## 2017-05-23 MED ORDER — FLEET ENEMA 7-19 GM/118ML RE ENEM: 1.0000 | ENEMA | Freq: Every day | RECTAL | 0 refills | Status: DC | PRN

## 2017-05-23 MED ORDER — GLYCOPYRROLATE 1 MG PO TABS: 1.0000 mg | ORAL_TABLET | ORAL | Status: DC | PRN

## 2017-05-23 MED ORDER — MORPHINE SULFATE (CONCENTRATE) 10 MG/0.5ML PO SOLN: 5.0000 mg | ORAL | Status: DC | PRN

## 2017-05-23 MED ORDER — ACETAMINOPHEN 325 MG PO TABS: 650.0000 mg | ORAL_TABLET | Freq: Four times a day (QID) | ORAL | Status: DC | PRN

## 2017-05-23 MED ORDER — ONDANSETRON 4 MG PO TBDP: 4.0000 mg | ORAL_TABLET | Freq: Four times a day (QID) | ORAL | 0 refills | Status: DC | PRN

## 2017-05-23 MED ORDER — BIOTENE DRY MOUTH MT LIQD: 15.0000 mL | OROMUCOSAL | Status: DC | PRN

## 2017-05-29 NOTE — Progress Notes (Addendum)
Patient will discharge to [facility] SNF Anticipated discharge date:  Family notified: family's phone numbers non working Transportation by: [place]  Nurse to call report to [phone]. Patient will go to room 313A at the facility.   CSW signing off.  Estanislado Emms, [place]  Clinical Social Worker

## 2017-05-29 NOTE — NC FL2 (Signed)
[place] MEDICAID FL2 LEVEL OF CARE SCREENING TOOL     IDENTIFICATION  Patient Name: William Nolan Birthdate: 30-Mar-1951 Sex: male Admission Date (Current Location): 05/05/2017  [facility] and [place] Number:  Herbalist and Address:  [facility], [place] [address] [place], [place] [address]      Provider Number: [id number]  Attending Physician Name and Address:  Roxan Hockey, MD  Relative Name and Phone Number:       Current Level of Care: Hospital Recommended Level of Care: [place] Prior Approval Number:    Date Approved/Denied:   PASRR Number: [id number]  Discharge Plan: SNF    Current Diagnoses: Patient Active Problem List   Diagnosis Date Noted  . Protein-calorie malnutrition, severe 05/18/2017  . Severe malnutrition ([place])   . Pressure injury of skin 05/07/2017    Orientation RESPIRATION BLADDER Height & Weight     Self  Normal Incontinent, External catheter Weight: 130 lb 11.7 oz (59.3 kg) Height:  5\' 9"  (175.3 cm)  BEHAVIORAL SYMPTOMS/MOOD NEUROLOGICAL BOWEL NUTRITION STATUS      Incontinent Diet(Please see DC Summary)  AMBULATORY STATUS COMMUNICATION OF NEEDS Skin   Extensive Assist Verbally PU Stage and Appropriate Care(Stage II on sacrum w/ foam dressing)                       Personal Care Assistance Level of Assistance  Bathing, Feeding, Dressing Bathing Assistance: Maximum assistance Feeding assistance: Maximum assistance Dressing Assistance: Maximum assistance     Functional Limitations Info  Sight, Hearing, Speech Sight Info: Adequate Hearing Info: Adequate Speech Info: Adequate    SPECIAL CARE FACTORS FREQUENCY  PT (By licensed PT), OT (By licensed OT), Speech therapy     PT Frequency: 5x/week OT Frequency: 3x/week     Speech Therapy Frequency: 2x/week      Contractures      Additional Factors Info  Code Status, Allergies, Insulin Sliding Scale Code Status Info:  DNR Allergies Info: Heparin   Insulin Sliding Scale Info: Every 4 hours       Current Medications ():  This is the current hospital active medication list Current Facility-Administered Medications  Medication Dose Route Frequency Provider Last Rate Last Dose  . acetaminophen (TYLENOL) tablet 650 mg  650 mg Oral Q6H PRN Allie Bossier, MD       Or  . acetaminophen (TYLENOL) suppository 650 mg  650 mg Rectal Q6H PRN Allie Bossier, MD      . antiseptic oral rinse (BIOTENE) solution 15 mL  15 mL Topical PRN Allie Bossier, MD      . chlorhexidine (PERIDEX) 0.12 % solution 15 mL  15 mL Mouth Rinse BID Erick Colace, NP   15 mL at  0930  . glycopyrrolate (ROBINUL) tablet 1 mg  1 mg Oral Q4H PRN Allie Bossier, MD       Or  . glycopyrrolate (ROBINUL) injection 0.2 mg  0.2 mg Subcutaneous Q4H PRN Allie Bossier, MD      . haloperidol (HALDOL) tablet 0.5 mg  0.5 mg Oral Q4H PRN Allie Bossier, MD       Or  . haloperidol (HALDOL) 2 MG/ML solution 0.5 mg  0.5 mg Sublingual Q4H PRN Allie Bossier, MD      . MEDLINE mouth rinse  15 mL Mouth Rinse q12n4p Erick Colace, NP   15 mL at 05/22/17 1524  . morphine 2 MG/ML injection  1 mg  1 mg Intravenous Q2H PRN Allie Bossier, MD      . morphine CONCENTRATE 10 MG/0.5ML oral solution 5 mg  5 mg Oral Q2H PRN Allie Bossier, MD      . ondansetron (ZOFRAN-ODT) disintegrating tablet 4 mg  4 mg Oral Q6H PRN Allie Bossier, MD       Or  . ondansetron [facility]) injection 4 mg  4 mg Intravenous Q6H PRN Allie Bossier, MD      . polyvinyl alcohol (LIQUIFILM TEARS) 1.4 % ophthalmic solution 1 drop  1 drop Both Eyes QID PRN Allie Bossier, MD      . sodium phosphate (FLEET) 7-19 GM/118ML enema 1 enema  1 enema Rectal Daily PRN Allie Bossier, MD         Discharge Medications: Please see discharge summary for a list of discharge medications.  Relevant Imaging Results:  Relevant Lab Results:   Additional Information     Estanislado Emms, LCSW

## 2017-05-29 NOTE — Clinical Social Work Placement (Signed)
   CLINICAL SOCIAL WORK PLACEMENT  NOTE  Date:    Patient Details  Name: William Nolan MRN: [id number] Date of Birth: 12-14-1950  Clinical Social Work is seeking post-discharge placement for this patient at the [place] level of care (*CSW will initial, date and re-position this form in  chart as items are completed):  Yes   Patient/family provided with [place] Work Department's list of facilities offering this level of care within the geographic area requested by the patient (or if unable, by the patient's family).  Yes   Patient/family informed of their freedom to choose among providers that offer the needed level of care, that participate in Medicare, Medicaid or managed care program needed by the patient, have an available bed and are willing to accept the patient.  Yes   Patient/family informed of 's ownership interest in [facility] and [facility], as well as of the fact that they are under no obligation to receive care at these facilities.  PASRR submitted to EDS on       PASRR number received on       Existing PASRR number confirmed on       FL2 transmitted to all facilities in geographic area requested by pt/family on       FL2 transmitted to all facilities within larger geographic area on       Patient informed that his/her managed care company has contracts with or will negotiate with certain facilities, including the following:  [facility]         Patient/family informed of bed offers received.  Patient chooses bed at       Physician recommends and patient chooses bed at      Patient to be transferred to [facility] on .  Patient to be transferred to facility by [facility]     Patient family notified on  of transfer.  Name of family member notified:        PHYSICIAN Please prepare priority discharge summary, including medications, Please prepare prescriptions, Please sign FL2      Additional Comment:    _______________________________________________ Estanislado Emms, LCSW , 3:07 PM

## 2017-05-29 NOTE — Progress Notes (Addendum)
CSW called patient's ldaughter, and number did not work. CSW spoke to Amy at [facility] and they have not been able to reach patient's daughter; [facility] previously had bed available for patient but were unable to admit patient as family did not complete and send paperwork.   CSW spoke to patient's SNF, [facility], and they are agreeable for patient to readmit with them. They have requested an updated discharge summary before 4 pm today. CSW paged MD. CSW to support with discharge.  William Nolan, [place]

## 2017-05-29 NOTE — Discharge Summary (Signed)
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 COMPARISON:  Chest radiograph 05/09/2017. Chest CT 05/06/2017, and earlier. FINDINGS: Portable AP semi upright view at 0728 hours. Right chest central line has been removed. NG type tube has been replaced with enteric feeding tube which courses to the abdomen, tip not included. Continued bilateral reticulonodular pulmonary opacity. More confluent opacity at the medial right lung base has not significantly changed. No pneumothorax. No pulmonary edema or pleural effusion. Stable cardiac size and mediastinal contours. IMPRESSION: 1. Right IJ central line removed, and NG tube exchanged for enteric feeding tube. 2. Nonspecific widespread pulmonary reticulonodular opacity persists. Stable patchy opacity at the right lung base. 3. See also CT Abdomen and Pelvis  findings today, reported separately. Electronically Signed   By: Genevie Ann M.D.   On: 05/13/2017 09:38   Dg Chest Port 1 View  Result Date: 05/09/2017 CLINICAL DATA:  Follow-up examination for respiratory failure. EXAM: PORTABLE CHEST 1 VIEW COMPARISON:  Prior radiograph from 05/08/2017. FINDINGS: Patient markedly rotated to the right. Enteric tube courses in the the abdomen. Right IJ approach central venous catheter remains in place with tip overlying the distal SVC. Cardiac and mediastinal silhouettes are stable, and remain within normal limits. Right hemidiaphragm remains elevated. Improved persistent but improved opacity within the right lung as compared to previous. Left basilar reticulonodular densities are relatively stable from previous. Left lung otherwise clear. No pulmonary edema. No appreciable pleural effusion. No pneumothorax. Osseous structures unchanged. IMPRESSION: Persistent elevation of the right hemidiaphragm with improved right lung infiltrates. Electronically Signed   By: Jeannine Boga M.D.   On: 05/09/2017 05:00   Dg Chest Port 1 View  Result Date: 05/08/2017 CLINICAL DATA:  Respiratory failure.  Shortness of breath. EXAM: PORTABLE CHEST 1 VIEW COMPARISON:  05/07/2017 FINDINGS: The patient is rotated to the right, limiting assessment of the mediastinal structures. The endotracheal and enteric tubes have been removed. Right jugular catheter terminates in the region of the mid SVC. There is increased elevation of the right hemidiaphragm with worsening patchy opacity throughout the right lung. Reticulonodular density in the left lower lung is similar to the prior study. No large pleural effusion or pneumothorax is identified, although the right lung apex is partially obscured by the patient's chin.  IMPRESSION: Interval extubation. Elevated right hemidiaphragm with increasing right lung infiltrates. Electronically Signed   By: Logan Bores M.D.   On: 05/08/2017 07:05   Dg Chest  Port 1 View  Result Date: 05/07/2017 CLINICAL DATA:  67 year old male with Respiratory distress. Bilateral pulmonary infection with complete right lower lobe atelectasis versus consolidation on chest CT yesterday. EXAM: PORTABLE CHEST 1 VIEW COMPARISON:  05/06/2017 and earlier. FINDINGS: Portable AP supine view at 0421 hours. Endotracheal tube tip is stable at the level the clavicles. Stable right IJ central line. Enteric tube courses to the abdomen, tip not included. Substantially improved right lower lobe ventilation with largely resolved right lower lobe opacity. Superimposed basilar predominant reticulonodular density is otherwise progressed since 05/05/2017. No pneumothorax. No definite pleural effusion. Stable cardiac size and mediastinal contours. IMPRESSION: 1.  Stable lines and tubes. 2. Largely resolved right lower lobe collapse/atelectasis since 05/06/2017. 3. Bilateral basilar predominant reticulonodular opacity has radiographically progressed since 05/05/17, compatible with the bilateral centrilobular pulmonary nodularity seen by CT yesterday. Favor bilateral viral/atypical respiratory infection (such as RSV, adenovirus, mycoplasma - or if immunocompromised: Invasive Aspergillus or TB). Electronically Signed   By: Genevie Ann M.D.   On: 05/07/2017 06:59   Dg Chest Port 1 View  Result Date: 05/06/2017 CLINICAL DATA:  Central line insertion EXAM: PORTABLE CHEST 1 VIEW COMPARISON:  May 05, 2017 FINDINGS: A new right central line terminates in the central SVC. The ETT is in good position. The NG tube terminates below today's film. No pneumothorax. The cardiomediastinal silhouette is stable. Effusion and opacity in the right base as well as scattered opacities in the left perihilar region are more pronounced in the interval. These findings are better seen on recent CT imaging. IMPRESSION: The new right central line is in good position with no pneumothorax. Bilateral pulmonary opacities and a right  effusion, better assessed on yesterday's CT scan. Electronically Signed   By: Dorise Bullion III M.D   On: 05/06/2017 01:37   Dg Chest Port 1 View  Result Date: 05/05/2017 CLINICAL DATA:  67 year old male with advancement of the endotracheal tube. EXAM: PORTABLE CHEST 1 VIEW COMPARISON:  Chest radiograph dated 05/05/2017 FINDINGS: There has been interval advancement of the endotracheal tube with tip now approximately 5.5 cm above the carina. The enteric tube extends into the left hemiabdomen with tip beyond the inferior margin of the image. There is mild eventration of the right hemidiaphragm. The lungs are clear. There is no pleural effusion or pneumothorax. The cardiac silhouette is within normal limits. No acute osseous pathology. IMPRESSION: Interval advancement of the endotracheal tube. Electronically Signed   By: Anner Crete M.D.   On: 05/05/2017 22:44   Dg Chest Portable 1 View  Result Date: 05/05/2017 CLINICAL DATA:  Hypoxia EXAM: PORTABLE CHEST 1 VIEW COMPARISON:  None. FINDINGS: Endotracheal tube tip is 12.3 cm above the carina at the cervical-thoracic junction. Nasogastric tube tip and side port below the diaphragm. No pneumothorax. A small portion of the left base is imaged. Visualized lungs appear clear. Heart size and pulmonary vascular normal. No adenopathy. No appreciable bone lesions. IMPRESSION: Endotracheal tube tip is 12.3 cm above the carina. It may be prudent to consider advancing endotracheal tube 5-6 cm. No pneumothorax. A small portion of the left base is not imaged. Visualized lungs appear clear. Cardiac silhouette is within normal limits. Electronically Signed   By: Lowella Grip III M.D.   On: 05/05/2017 21:36   Dg Abd Portable 1v  Result Date: 05/08/2017  CLINICAL DATA:  Nasogastric tube placement EXAM: PORTABLE ABDOMEN - 1 VIEW COMPARISON:  None. FINDINGS: Nasogastric tube tip and side port are in the distal stomach. There is no appreciable bowel dilatation or  air-fluid level to suggest bowel obstruction. No free air. There are multiple foci of arterial vascular calcification. Visualized lung bases are clear. IMPRESSION: Nasogastric tube tip and side port in distal stomach. No bowel obstruction or free air evident. Multiple foci of vascular calcification apparent. Electronically Signed   By: Lowella Grip III M.D.   On: 05/08/2017 10:20   Dg Swallowing Func-speech Pathology  Result Date: 05/15/2017 Objective Swallowing Evaluation: Type of Study: MBS-Modified Barium Swallow Study  Patient Details Name: Saqib Cazarez MRN: [id number] Date of Birth: 07-12-1950 Today's Date: 05/15/2017 Time: SLP Start Time (ACUTE ONLY): 1029 -SLP Stop Time (ACUTE ONLY): 1100 SLP Time Calculation (min) (ACUTE ONLY): 31 min Past Medical History: Past Medical History: Diagnosis Date . Coronary artery disease  . Stroke [facility])  Past Surgical History: HPI: Pt is a 67 y.o.malewith PMH of prior CVA with Left sided deficits (also with global aphasia, dysphagia), HTN, HLD,and CADadmitted for Acute hypoxic event and fever after reported choking event at SNF, also found to have a small subacute L frontal lobe infarct. His SNF believes that the pt's swallowing has worsened over the past few weeks. He was intubated 12/8 and was extubated 12/10 when the ETT was dislodged during bathing.  Subjective: pt alert, repeatedly asking for help Assessment / Plan / Recommendation CHL IP CLINICAL IMPRESSIONS 05/15/2017 Clinical Impression Pt has a severe oropharyngeal dysphagia with limited ability to follow commands to attempt many strategies other than bolus modification. He has oral holding and poor bolus cohesion, requiring Mod increased to Max cues to initiate posterior transit. He does not sufficiently clear POs from his mouth, intermittently letting is spill anteriorly from his lips, and ultimately needing oral suction. Pt's pharyngeal swallow is characterized by limited hyolaryngeal movement, base of  tongue retraction, and pharyngeal squeeze, all of which results in significant pharyngeal residue, although mostly in the valleculae, with all consistencies tested. It also leads to incomplete glottal closure with silent penetration and/or aspiration on almost every bolus. Pt would be at a high risk for aspiration with any consistency, and is at risk for malnutrition and dehydration also given his cognitive status. I am not sure if he would be a good candidate for a PEG given his mental status and the fact that it would not eliminate his risk for aspiration. Should the decision be made to pursue a comfort-based approach, since any texture would be at risk, I would recommend Dys 1 diet and thin liquids.  SLP Visit Diagnosis Dysphagia, oropharyngeal phase (R13.12) Attention and concentration deficit following -- Frontal lobe and executive function deficit following -- Impact on safety and function Severe aspiration risk;Risk for inadequate nutrition/hydration   CHL IP TREATMENT RECOMMENDATION 05/15/2017 Treatment Recommendations Therapy as outlined in treatment plan below   Prognosis 05/15/2017 Prognosis for Safe Diet Advancement Guarded Barriers to Reach Goals Cognitive deficits;Severity of deficits Barriers/Prognosis Comment -- CHL IP DIET RECOMMENDATION 05/15/2017 SLP Diet Recommendations NPO;Other (Comment) Liquid Administration via -- Medication Administration Via alternative means Compensations -- Postural Changes --   CHL IP OTHER RECOMMENDATIONS 05/15/2017 Recommended Consults -- Oral Care Recommendations Oral care QID Other Recommendations Have oral suction available   CHL IP FOLLOW UP RECOMMENDATIONS 05/15/2017 Follow up Recommendations Skilled Nursing facility   [facility] IP FREQUENCY AND DURATION 05/15/2017 Speech Therapy Frequency (ACUTE ONLY) min  2x/week Treatment Duration 2 weeks      CHL IP ORAL PHASE 05/15/2017 Oral Phase Impaired Oral - Pudding Teaspoon -- Oral - Pudding Cup -- Oral - Honey Teaspoon  Reduced posterior propulsion;Holding of bolus;Lingual/palatal residue;Delayed oral transit;Decreased bolus cohesion;Premature spillage;Other (Comment) Oral - Honey Cup -- Oral - Nectar Teaspoon -- Oral - Nectar Cup -- Oral - Nectar Straw Reduced posterior propulsion;Holding of bolus;Lingual/palatal residue;Delayed oral transit;Decreased bolus cohesion;Premature spillage Oral - Thin Teaspoon -- Oral - Thin Cup Reduced posterior propulsion;Holding of bolus;Lingual/palatal residue;Delayed oral transit;Decreased bolus cohesion;Premature spillage Oral - Thin Straw Reduced posterior propulsion;Holding of bolus;Lingual/palatal residue;Delayed oral transit;Decreased bolus cohesion;Premature spillage Oral - Puree Reduced posterior propulsion;Holding of bolus;Lingual/palatal residue;Delayed oral transit;Decreased bolus cohesion;Premature spillage Oral - Mech Soft -- Oral - Regular -- Oral - Multi-Consistency -- Oral - Pill -- Oral Phase - Comment --  CHL IP PHARYNGEAL PHASE 05/15/2017 Pharyngeal Phase Impaired Pharyngeal- Pudding Teaspoon -- Pharyngeal -- Pharyngeal- Pudding Cup -- Pharyngeal -- Pharyngeal- Honey Teaspoon Reduced pharyngeal peristalsis;Reduced epiglottic inversion;Reduced anterior laryngeal mobility;Reduced laryngeal elevation;Reduced airway/laryngeal closure;Reduced tongue base retraction;Penetration/Aspiration during swallow;Penetration/Apiration after swallow;Pharyngeal residue - valleculae;Pharyngeal residue - pyriform;Pharyngeal residue - posterior pharnyx Pharyngeal Material enters airway, remains ABOVE vocal cords and not ejected out Pharyngeal- Honey Cup -- Pharyngeal -- Pharyngeal- Nectar Teaspoon -- Pharyngeal -- Pharyngeal- Nectar Cup -- Pharyngeal -- Pharyngeal- Nectar Straw Reduced pharyngeal peristalsis;Reduced epiglottic inversion;Reduced anterior laryngeal mobility;Reduced laryngeal elevation;Reduced airway/laryngeal closure;Reduced tongue base retraction;Penetration/Aspiration during  swallow;Penetration/Apiration after swallow;Pharyngeal residue - valleculae;Pharyngeal residue - pyriform;Pharyngeal residue - posterior pharnyx Pharyngeal Material enters airway, passes BELOW cords without attempt by patient to eject out (silent aspiration) Pharyngeal- Thin Teaspoon -- Pharyngeal -- Pharyngeal- Thin Cup Reduced pharyngeal peristalsis;Reduced epiglottic inversion;Reduced anterior laryngeal mobility;Reduced laryngeal elevation;Reduced airway/laryngeal closure;Reduced tongue base retraction;Penetration/Aspiration during swallow;Penetration/Apiration after swallow;Pharyngeal residue - valleculae;Pharyngeal residue - pyriform;Pharyngeal residue - posterior pharnyx Pharyngeal Material enters airway, remains ABOVE vocal cords and not ejected out Pharyngeal- Thin Straw Reduced pharyngeal peristalsis;Reduced epiglottic inversion;Reduced anterior laryngeal mobility;Reduced laryngeal elevation;Reduced airway/laryngeal closure;Reduced tongue base retraction;Penetration/Aspiration during swallow;Penetration/Apiration after swallow;Pharyngeal residue - valleculae;Pharyngeal residue - pyriform;Pharyngeal residue - posterior pharnyx Pharyngeal Material enters airway, passes BELOW cords without attempt by patient to eject out (silent aspiration) Pharyngeal- Puree Reduced pharyngeal peristalsis;Reduced epiglottic inversion;Reduced anterior laryngeal mobility;Reduced laryngeal elevation;Reduced airway/laryngeal closure;Reduced tongue base retraction;Penetration/Apiration after swallow;Pharyngeal residue - valleculae;Pharyngeal residue - pyriform;Pharyngeal residue - posterior pharnyx Pharyngeal Material enters airway, passes BELOW cords without attempt by patient to eject out (silent aspiration) Pharyngeal- Mechanical Soft -- Pharyngeal -- Pharyngeal- Regular -- Pharyngeal -- Pharyngeal- Multi-consistency -- Pharyngeal -- Pharyngeal- Pill -- Pharyngeal -- Pharyngeal Comment --  CHL IP CERVICAL ESOPHAGEAL PHASE  05/15/2017 Cervical Esophageal Phase Impaired Pudding Teaspoon -- Pudding Cup -- Honey Teaspoon Reduced cricopharyngeal relaxation Honey Cup -- Nectar Teaspoon -- Nectar Cup -- Nectar Straw Reduced cricopharyngeal relaxation Thin Teaspoon -- Thin Cup Reduced cricopharyngeal relaxation Thin Straw Reduced cricopharyngeal relaxation Puree Reduced cricopharyngeal relaxation Mechanical Soft -- Regular -- Multi-consistency -- Pill -- Cervical Esophageal Comment -- No flowsheet data found. Germain Osgood 05/15/2017, 12:11 PM  Germain Osgood, M.A. CCC-SLP [phone]              Micro Results   No results found for this or any previous visit (from the past 240 hour(s)).     Today   Subjective    Ramon Zanders today is resting in bed, no fevers,   Patient has been seen and examined prior to discharge   Objective   Blood pressure 103/68, pulse (!) 136,  temperature 98.4 F (36.9 C), temperature source Oral, resp. rate 19, height 5\' 9"  (1.753 m), weight 59.3 kg (130 lb 11.7 oz), SpO2 94 %.   Intake/Output Summary (Last 24 hours) at  1500 Last data filed at  1300 Gross per 24 hour  Intake 295 ml  Output 825 ml  Net -530 ml    Exam Physical Exam:  General: A/O 1, No acute respiratory distress, awake , cachectic looking Neck:  Negative  JVD Lungs: Clear to auscultation bilaterally without wheezes or crackles Cardiovascular: Regular rate and rhythm ,normal S1 and S2 Abdomen: negative abdominal pain, nondistended, positive soft, bowel sounds,  Extremities: No significant  edema  Skin: jaundice Psychiatric: Flat affect Central nervous system:  Follows commands. Bilateral lower extremity strength 1/5, right upper extremity strength 3/5, sensation intact throughout  negative dysarthria, negative expressive aphasia, negative receptive aphasia    Data Review   CBC w Diff:  Lab Results  Component Value Date   WBC 14.3 (H) 05/17/2017   HGB 8.7 (L) 05/17/2017    HCT 27.5 (L) 05/17/2017   PLT 309 05/17/2017   LYMPHOPCT 6 05/10/2017   MONOPCT 3 05/10/2017   EOSPCT 2 05/10/2017   BASOPCT 0 05/10/2017    CMP:  Lab Results  Component Value Date   NA 138 05/17/2017   K 3.8 05/17/2017   CL 104 05/17/2017   CO2 26 05/17/2017   BUN 18 05/17/2017   CREATININE 0.88 05/17/2017   PROT 5.2 (L) 05/10/2017   ALBUMIN 1.7 (L) 05/10/2017   BILITOT 1.1 05/10/2017   ALKPHOS 418 (H) 05/10/2017   AST 46 (H) 05/10/2017   ALT 37 05/10/2017  . Please see prior discharge summary dated 05/18/2017   Disposition- Patient has lost 2 beds at [facility] secondary to daughter not filling out appropriate paperwork,  [facility]....  Will discharge back to [facility] with comfort/palliative care only   Total Discharge time is about 33 minutes  Roxan Hockey M.D on  at 3:00 PM  [place]  [phone]  Voice Recognition Viviann Spare dictation system was used to create this note, attempts have been made to correct errors. Please contact the author with questions and/or clarifications.

## 2017-05-29 NOTE — Progress Notes (Addendum)
[facility] Liaison:  RN  Called and spoke with Manuela Schwartz, CSW, she is going to try to get in touch with patient's daughter.   She will touch base with me to let me know if there's anything I can do to help.   Thank you.  Edyth Gunnels, RN, BSN [facility] Liaison [phone]  All hospital liaisons are now on [employer].

## 2017-05-29 NOTE — Progress Notes (Signed)
Pt prepared for d/c to SNF. IV d/c'd. Skin intact except as charted in most recent assessments. Vitals are stable. Report called to receiving facility, [facility] and report given to [employer]. Pt to be transported by ambulance service.

## 2017-05-29 DEATH — deceased

## 2019-08-05 IMAGING — MR MR HEAD W/O CM
9 of 10 series · 35 of 48 positions shown · non-contrast
Comparison: 05/05/2017 CT head

CLINICAL DATA: 66 y/o M; patchy hypoxic event and fever. History of
stroke.

EXAM:
MRI HEAD WITHOUT CONTRAST
TECHNIQUE: Multiplanar, multiecho pulse sequences of the brain and surrounding
structures were obtained without intravenous contrast.

[Series 3: DWI · axial · 3.0mm · 0.94mm/px · z∈[-34,+113]mm · 9 of 100 slices shown (1 of 2)]
[im 1/100]
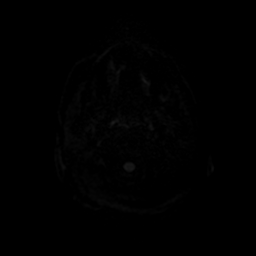
[im 13/100]
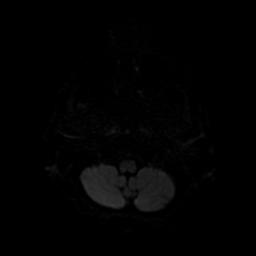
[im 25/100]
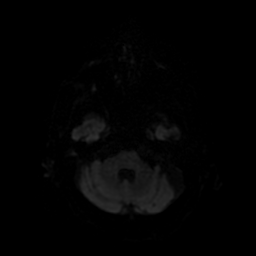
[im 38/100]
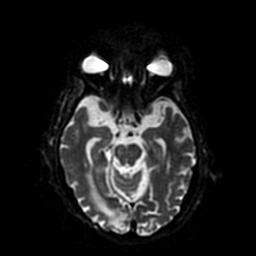
[im 50/100]
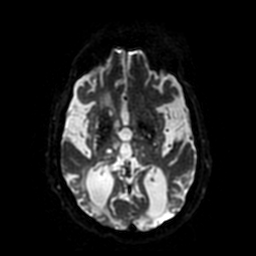
[im 62/100]
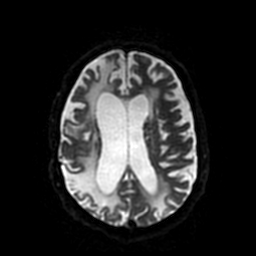
[im 75/100]
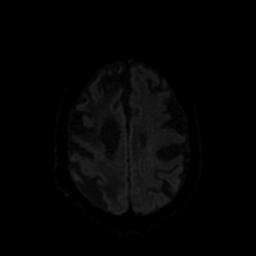
[im 87/100]
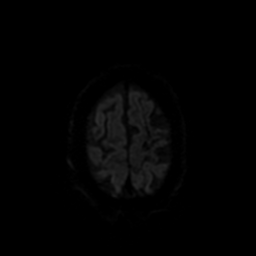
[im 100/100]
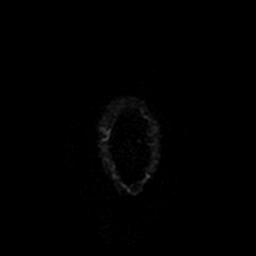

[Series 4: DWI · coronal · 4.0mm · 0.94mm/px · 7 of 72 slices shown (2 of 2)]
[im 1/72]
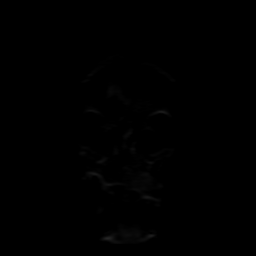
[im 12/72]
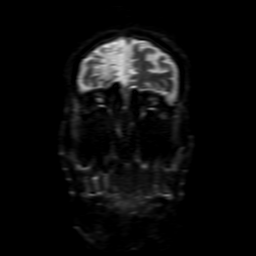
[im 24/72]
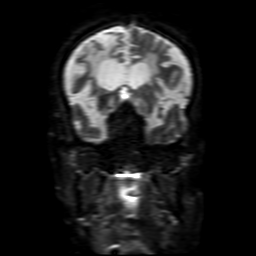
[im 36/72]
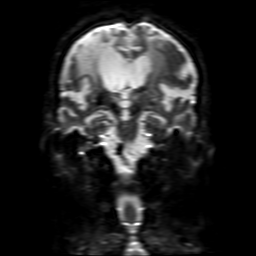
[im 48/72]
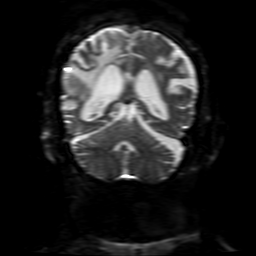
[im 60/72]
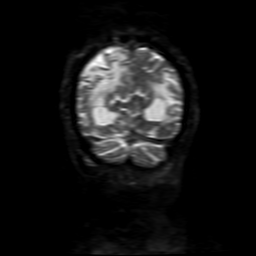
[im 72/72]
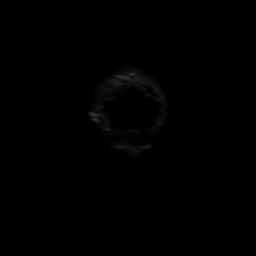

[Series 5: FLAIR · sagittal · 5.0mm · 0.47mm/px · 2 of 23 slices shown (1 of 2)]
[im 1/23]
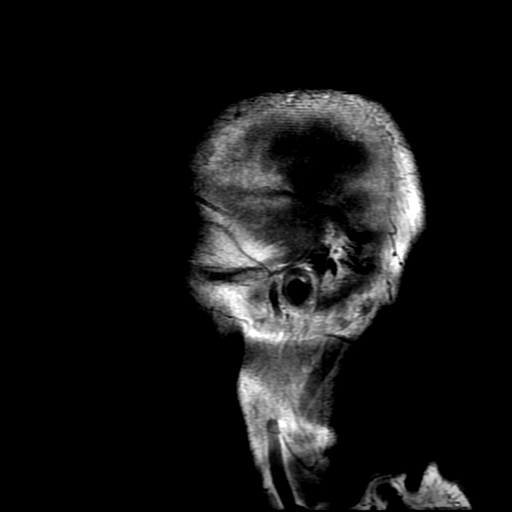
[im 23/23]
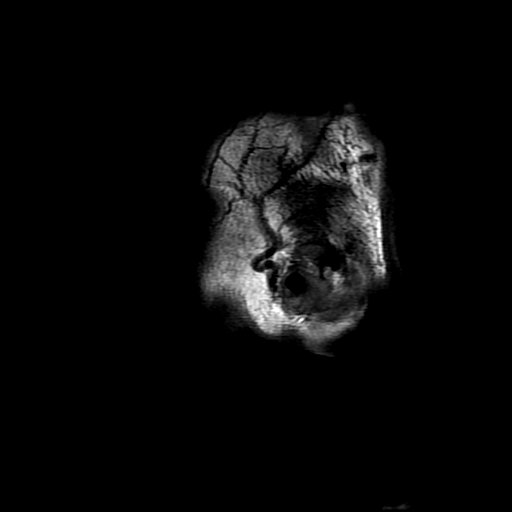

[Series 6: T2 · axial · 5.0mm · 0.43mm/px · z∈[-30,+119]mm · 2 of 26 slices shown (1 of 2)]
[im 1/26]
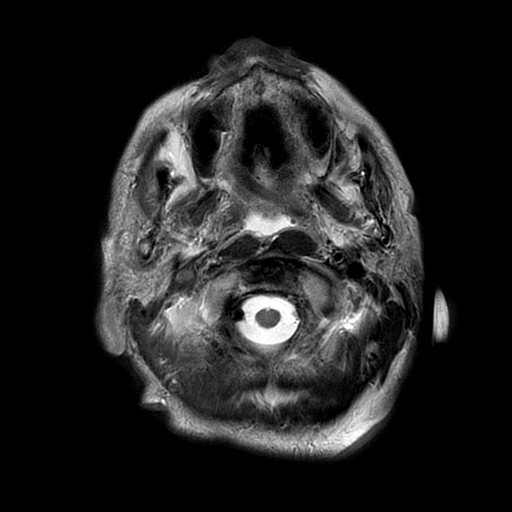
[im 26/26]
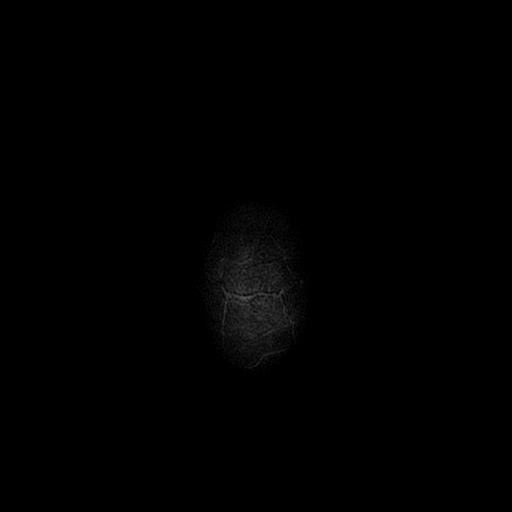

[Series 7: FLAIR · axial · 3.0mm · 0.43mm/px · z∈[-30,+119]mm · 2 of 26 slices shown (2 of 2)]
[im 1/26]
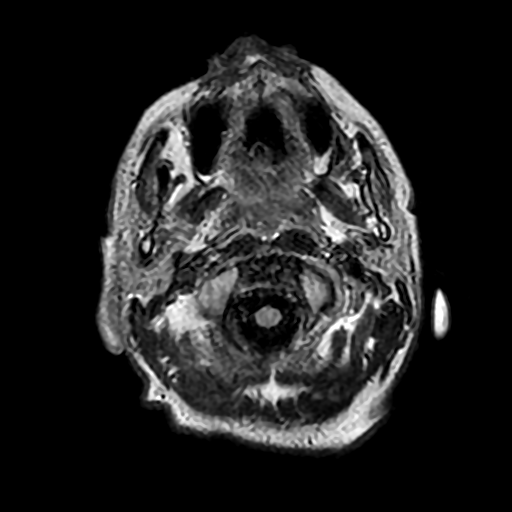
[im 26/26]
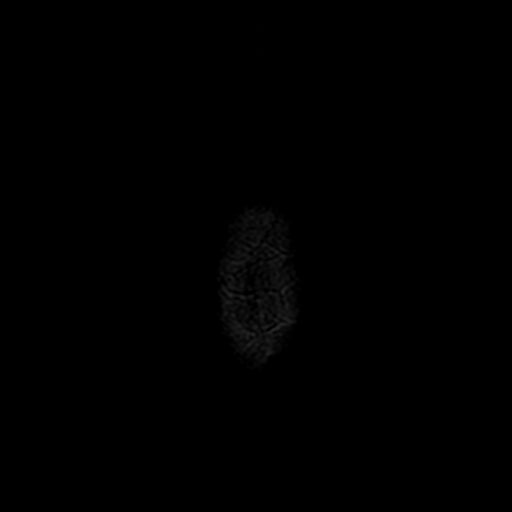

[Series 8: (person_name) · axial · 3.0mm · 0.47mm/px · z∈[-31,-14]mm · 2 of 104 slices shown]
[im 1/104]
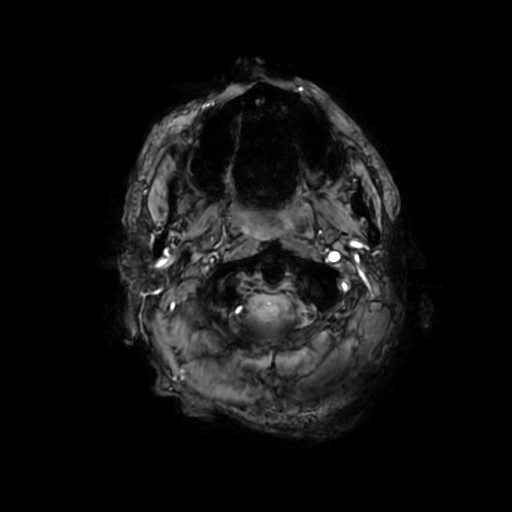
[im 12/104]
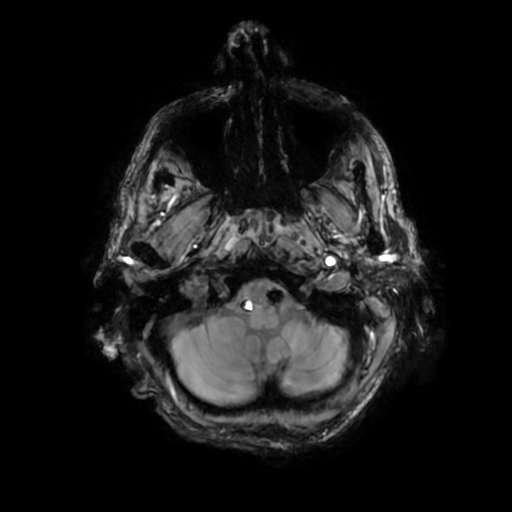

[Series 10: T2 · coronal · 5.0mm · 0.39mm/px · 3 of 30 slices shown (2 of 2)]
[im 1/30]
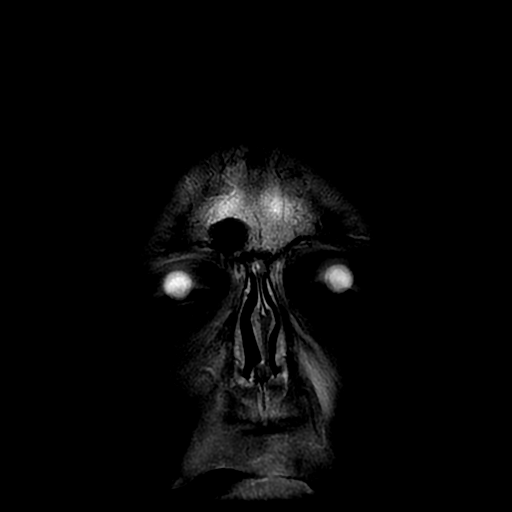
[im 15/30]
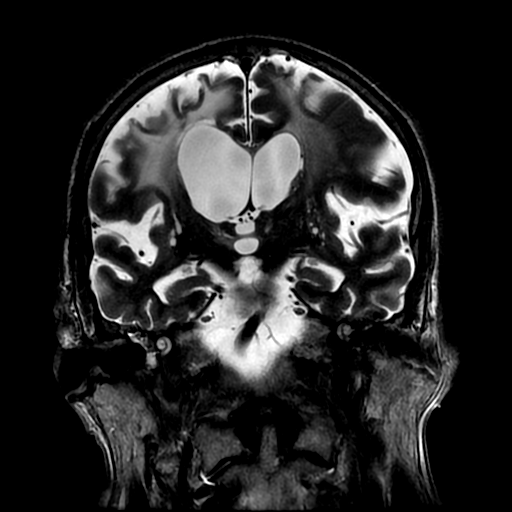
[im 30/30]
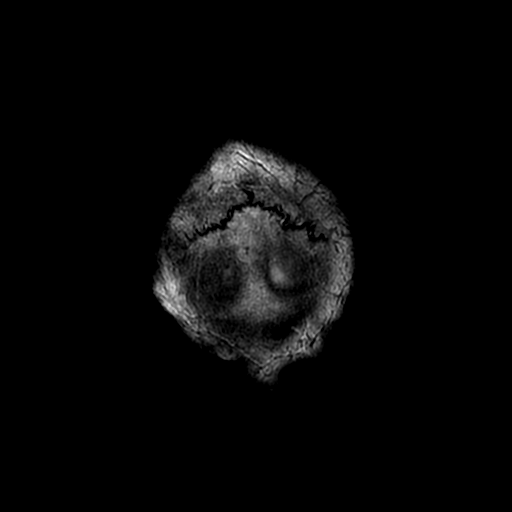

[Series 350: ADC · axial · 3.0mm · 0.94mm/px · z∈[-34,+113]mm · 5 of 50 slices shown (1 of 2)]
[im 1/50]
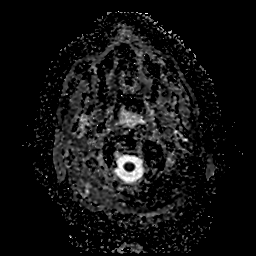
[im 13/50]
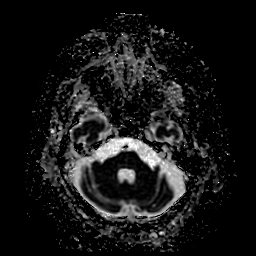
[im 25/50]
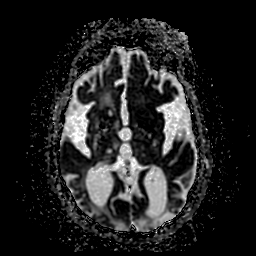
[im 37/50]
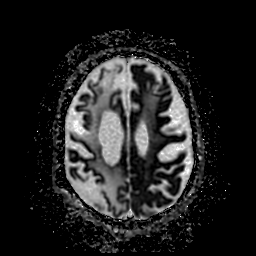
[im 50/50]
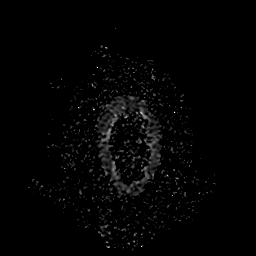

[Series 450: ADC · coronal · 4.0mm · 0.94mm/px · 3 of 36 slices shown (2 of 2)]
[im 1/36]
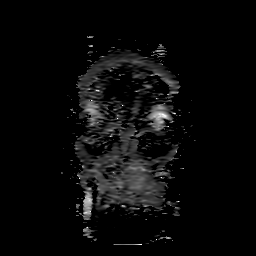
[im 18/36]
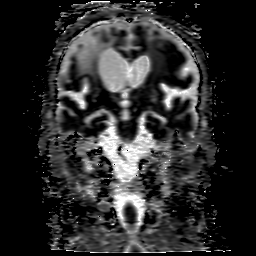
[im 36/36]
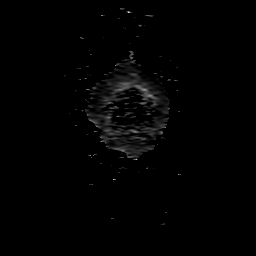

[35 of 48 positions shown; findings below may reference images not displayed]

FINDINGS: Brain: Punctate focus of reduced diffusion in left posterolateral
subcortical white matter (series 3, image 36) compatible with acute/
early subacute. Extensive encephalomalacia involving bilateral
occipital lobes, left posterior parietal lobe, diffusely throughout
the left frontal parietal convexity, and left watershed distribution
likely representing chronic infarction. There are numerous chronic
lacunar infarctions throughout the basal ganglia bilaterally, right
hemi pons, and multiple small chronic infarctions within the
cerebellum.

Severe brain parenchymal volume loss. Ex vacuo dilatation of right
lateral ventricle. Advanced chronic microvascular ischemic changes
of white matter. Several cortical and basal ganglia infarcts
demonstrate hemosiderin staining. Additionally, there are a few
punctate foci of susceptibility hypointensity within the cerebellum
likely representing hemosiderin deposition of chronic
microhemorrhage.

Vascular: Loss of right ICA flow void, likely chronic occlusion.
Question left V4 segment stent.

Skull and upper cervical spine: Normal marrow signal.

Sinuses/Orbits: Bilateral intra-ocular lens replacement. Bilateral
mastoid effusions likely due to intubation. No abnormal signal of
paranasal sinuses.

Other: None.
IMPRESSION: 1. Punctate acute/early subacute infarction within the left
posterolateral parietal lobe. No associated hemorrhage or mass
effect.
2. Extensive chronic infarction of the right greater than left
cerebral hemispheres in both MCA and PCA territories.
3. Onset right ICA flow void, question occlusion, likely chronic.

By: Quirijn Amazigh M.D.

## 2019-08-07 IMAGING — DX DG CHEST 1V PORT
1 series · 1 of 1 positions shown · non-contrast
Comparison: Prior radiograph from 05/08/2017.

CLINICAL DATA: Follow-up examination for respiratory failure.

EXAM:
PORTABLE CHEST 1 VIEW

[chest ap]
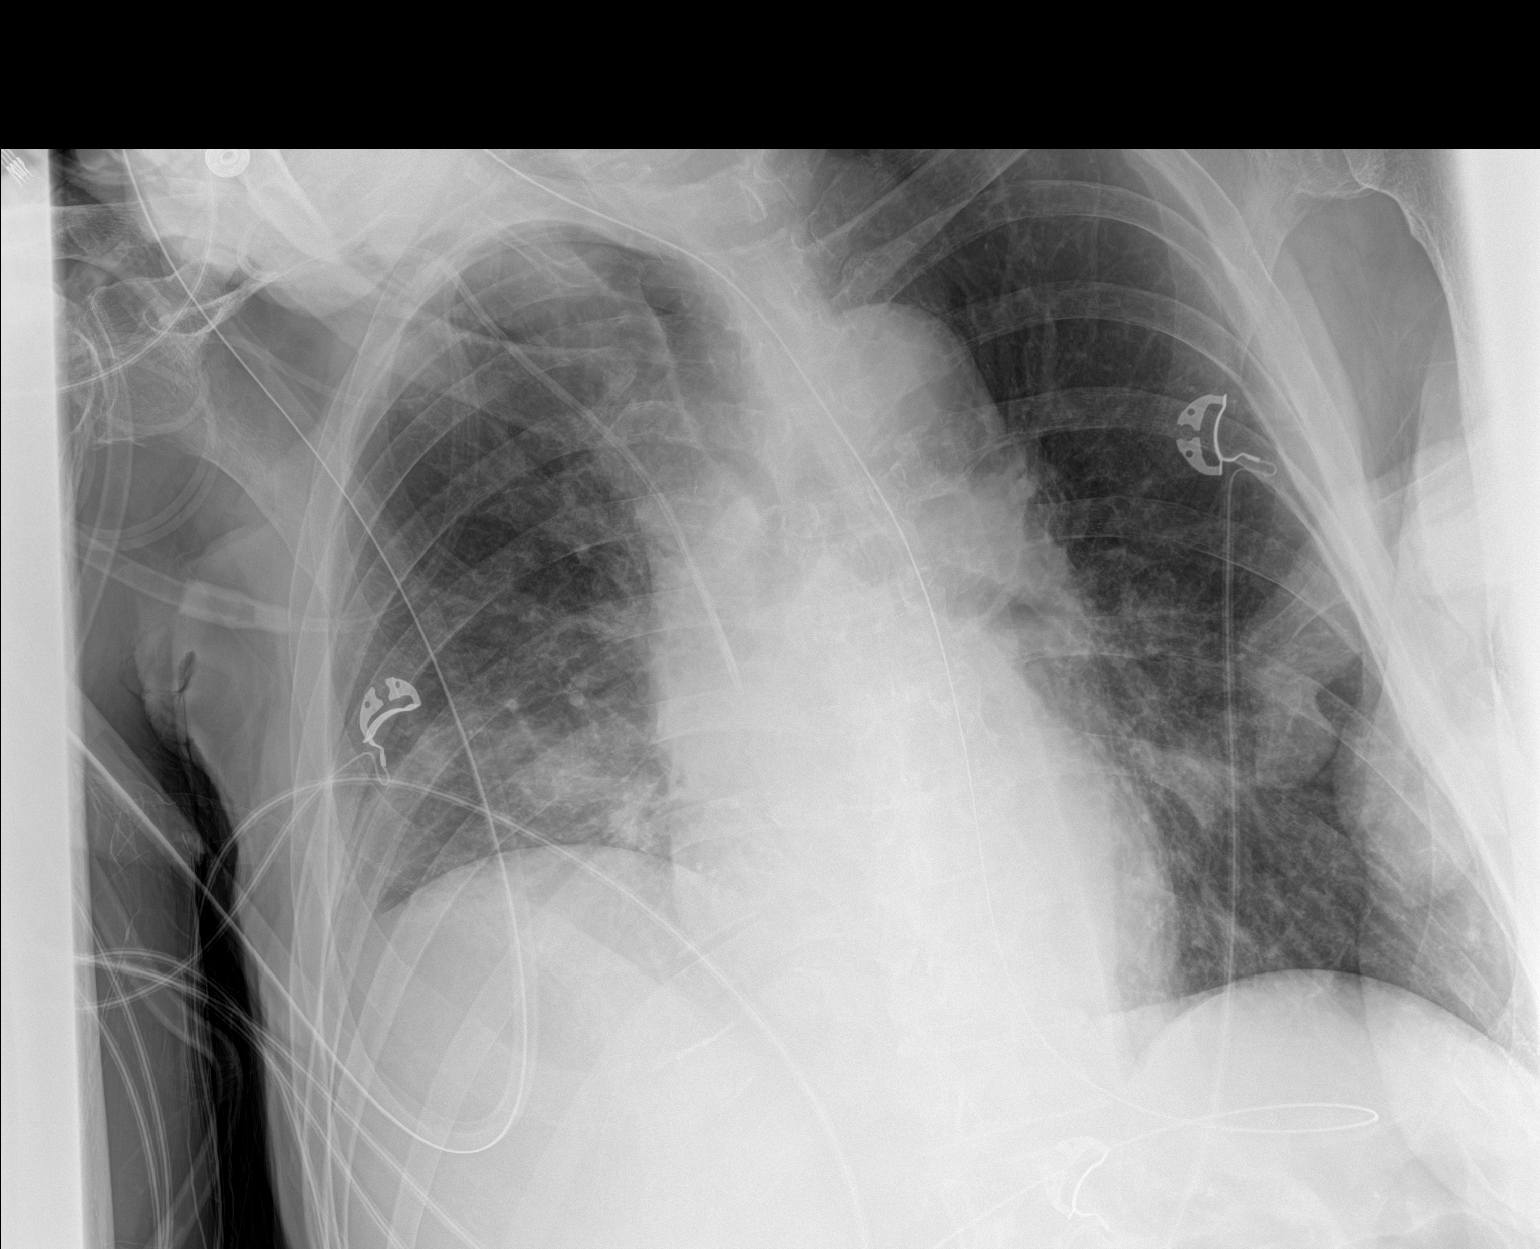

[1 of 1 positions shown; findings below may reference images not displayed]

FINDINGS: Patient markedly rotated to the right. Enteric tube courses in the
the abdomen. Right IJ approach central venous catheter remains in
place with tip overlying the distal SVC. Cardiac and mediastinal
silhouettes are stable, and remain within normal limits.

Right hemidiaphragm remains elevated. Improved persistent but
improved opacity within the right lung as compared to previous. Left
basilar reticulonodular densities are relatively stable from
previous. Left lung otherwise clear. No pulmonary edema. No
appreciable pleural effusion. No pneumothorax.

Osseous structures unchanged.
IMPRESSION: Persistent elevation of the right hemidiaphragm with improved right
lung infiltrates.
